# Patient Record
Sex: Female | Born: 1990 | Race: Black or African American | Hispanic: No | Marital: Single | Smoking: Never smoker
Health system: Southern US, Community
[De-identification: ages and names within clinical notes are randomized; demographics above are authoritative.]

## PROBLEM LIST (undated history)

## (undated) DIAGNOSIS — Z789 Other specified health status: Secondary | ICD-10-CM

## (undated) HISTORY — PX: DILATION AND CURETTAGE OF UTERUS: SHX78

---
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
## 2010-04-29 ENCOUNTER — Emergency Department (HOSPITAL_COMMUNITY): Admission: EM | Admit: 2010-04-29 | Discharge: 2010-04-29 | Payer: Self-pay | Admitting: Family Medicine

## 2010-08-14 NOTE — L&D Delivery Note (Signed)
Delivery Note At 11:15 PM a viable and healthy female was delivered via Vaginal, Spontaneous Delivery (Presentation:Right ; Occiput Anterior).  APGAR: , ; weight .   Placenta status: Intact , Spontaneous.  Cord: 3 vessels with a loose nuchal cord x 1  Pt was induced for Rh isoimmunization with rising antibody titers.  She was found to be complete and delivered a vigorous female infant in the vertex ROA presentation.  Cord was clamped and cut and the infant was passed to the patients abdomen.  Placenta was delivered spontaneously, intact, with 3VC.  Cord blood was collected.  A small First degree laceration was repaired with a figure-of-eight suture of 3-0 vicryl.  Mom and baby are doing well.  The infant will be transferred to the nursery for observation.  Anesthesia: Epidural  Episiotomy: None Lacerations: First degree Suture Repair: 3.0 vicryl Est. Blood Loss (mL): 250  Mom to postpartum.  Baby to nursery-stable.  Ashe Gago H. 07/12/2011, 11:34 PM

## 2010-10-27 LAB — POCT URINALYSIS DIPSTICK
Bilirubin Urine: NEGATIVE
Glucose, UA: NEGATIVE mg/dL
Hgb urine dipstick: NEGATIVE
Nitrite: NEGATIVE
Protein, ur: 100 mg/dL — AB
Specific Gravity, Urine: >=1.03 (ref 1.005–1.030)
Urobilinogen, UA: 0.2 mg/dL (ref 0.0–1.0)
pH: 6 (ref 5.0–8.0)

## 2010-10-27 LAB — POCT PREGNANCY, URINE: Preg Test, Ur: NEGATIVE

## 2010-12-14 LAB — RPR: RPR: NONREACTIVE

## 2010-12-14 LAB — HIV ANTIBODY (ROUTINE TESTING W REFLEX): HIV: NONREACTIVE

## 2010-12-14 LAB — RUBELLA ANTIBODY, IGM: Rubella: IMMUNE

## 2010-12-14 LAB — HEPATITIS B SURFACE ANTIGEN: Hepatitis B Surface Ag: NEGATIVE

## 2010-12-27 NOTE — Op Note (Signed)
NAME:  Pamela Buchanan, Pamela Buchanan NO.:  [id number]   MEDICAL RECORD NO.:  [id number]          PATIENT TYPE:  AMB   LOCATION:  SDC                           FACILITY:  [facility]   PHYSICIAN:  Crist Fat. Rivard, M.D. DATE OF BIRTH:  03/07/1991   DATE OF PROCEDURE:  07/14/2007  DATE OF DISCHARGE:                               OPERATIVE REPORT   PREOPERATIVE DIAGNOSIS:  Missed abortion.   POSTOPERATIVE DIAGNOSIS:  Missed abortion.   ANESTHESIA:  IV sedation and paracervical block.   PROCEDURE:  D and C.   SURGEON:  Silverio Lay, M.D.   ESTIMATED BLOOD LOSS:  Minimal.   PROCEDURE:  After being informed of the planned procedure with possible  complications including bleeding, infection and injury to uterus,  informed consent was obtained.  The patient was taken to OR #4 and given  IV sedation without complication.  She was placed in the lithotomy  position, prepped and draped in a sterile fashion and her bladder was  emptied with an in-and-out Foley catheter.  Pelvic exam reveals an  anteverted uterus, 10-12 weeks in size, mobile and two normal adnexa.  A  weighted speculum is inserted.  Then 1 cc of Novocaine 1% is injected in  the anterior lip of the cervix which is now grasped with a tenaculum  forceps.  We then proceed with a paracervical block using Novocaine 1%  20 cc in the usual fashion.  The cervix was already dilated and easily  allows a #31 Hegar dilator and the uterus is sounded at 9.5 cm.  Using a  #10 curved cannula we proceed with evacuation of the uterus which  suctioned a large amount of products of conception.  After evacuation is  completed a sharp curette is used to assess the endometrial cavity which  appears to be free of tissue including board cornua.  During that time  bleeding is a little heavier and nitrous oxide was discontinued and  Cytotec 1000 mcg per rectum was given to the patient.  Instruments were  then removed and a bimanual massage of the  uterus was performed.  Uterus  is now firm and bleeding is minimal.   Instrument and sponge count was complete x2.  Estimated blood loss  overall is minimal.  The procedure was very well tolerated by the  patient who is taken to the recovery room in a well and stable  condition.      Crist Fat Rivard, M.D.  Electronically Signed     SAR/MEDQ  D:  07/14/2007  T:  07/14/2007  Job:  [id number]

## 2011-02-03 ENCOUNTER — Inpatient Hospital Stay (HOSPITAL_COMMUNITY)
Admission: AD | Admit: 2011-02-03 | Discharge: 2011-02-04 | Disposition: A | Discharge status: Home / Self Care | Payer: Medicaid Other | Source: Ambulatory Visit | Attending: Obstetrics & Gynecology | Admitting: Obstetrics & Gynecology

## 2011-02-03 DIAGNOSIS — Z79899 Other long term (current) drug therapy: Secondary | ICD-10-CM | POA: Insufficient documentation

## 2011-02-03 DIAGNOSIS — O210 Mild hyperemesis gravidarum: Secondary | ICD-10-CM

## 2011-02-03 LAB — URINALYSIS, ROUTINE W REFLEX MICROSCOPIC
Glucose, UA: NEGATIVE mg/dL
Nitrite: NEGATIVE
Specific Gravity, Urine: 1.01 (ref 1.005–1.030)
pH: 6 (ref 5.0–8.0)

## 2011-02-03 LAB — URINE MICROSCOPIC-ADD ON

## 2011-03-25 LAB — GC/CHLAMYDIA PROBE AMP, GENITAL: Chlamydia: POSITIVE

## 2011-05-19 ENCOUNTER — Encounter (HOSPITAL_COMMUNITY): Payer: Self-pay | Admitting: *Deleted

## 2011-05-23 LAB — HCG, QUANTITATIVE, PREGNANCY: hCG, Beta Chain, Quant, S: 2305 — ABNORMAL HIGH

## 2011-05-23 LAB — CBC
HCT: 36.3
Hemoglobin: 12.3
MCHC: 34
RBC: 3.99

## 2011-05-23 LAB — URINALYSIS, ROUTINE W REFLEX MICROSCOPIC
Nitrite: NEGATIVE
Protein, ur: NEGATIVE
Urobilinogen, UA: 0.2

## 2011-05-23 LAB — ABO/RH: Weak D: NEGATIVE

## 2011-05-23 LAB — POCT PREGNANCY, URINE
Operator id: 117411
Preg Test, Ur: POSITIVE

## 2011-05-23 LAB — WET PREP, GENITAL

## 2011-05-23 LAB — RH IMMUNE GLOBULIN WORKUP (NOT WOMEN'S HOSP): Antibody Screen: NEGATIVE

## 2011-05-23 LAB — GC/CHLAMYDIA PROBE AMP, GENITAL: GC Probe Amp, Genital: NEGATIVE

## 2011-05-23 LAB — URINE MICROSCOPIC-ADD ON

## 2011-06-14 ENCOUNTER — Other Ambulatory Visit (HOSPITAL_COMMUNITY): Payer: Self-pay | Admitting: Obstetrics & Gynecology

## 2011-06-14 DIAGNOSIS — O360190 Maternal care for anti-D [Rh] antibodies, unspecified trimester, not applicable or unspecified: Secondary | ICD-10-CM

## 2011-06-14 DIAGNOSIS — O2690 Pregnancy related conditions, unspecified, unspecified trimester: Secondary | ICD-10-CM

## 2011-06-16 ENCOUNTER — Ambulatory Visit (HOSPITAL_COMMUNITY): Payer: Medicaid Other

## 2011-06-22 ENCOUNTER — Other Ambulatory Visit (HOSPITAL_COMMUNITY): Payer: Medicaid Other

## 2011-06-23 ENCOUNTER — Ambulatory Visit (HOSPITAL_COMMUNITY)
Admission: RE | Admit: 2011-06-23 | Discharge: 2011-06-23 | Disposition: A | Discharge status: Home / Self Care | Payer: Medicaid Other | Source: Ambulatory Visit | Attending: Obstetrics & Gynecology | Admitting: Obstetrics & Gynecology

## 2011-06-23 DIAGNOSIS — O360990 Maternal care for other rhesus isoimmunization, unspecified trimester, not applicable or unspecified: Secondary | ICD-10-CM | POA: Insufficient documentation

## 2011-06-23 DIAGNOSIS — O360190 Maternal care for anti-D [Rh] antibodies, unspecified trimester, not applicable or unspecified: Secondary | ICD-10-CM

## 2011-06-23 DIAGNOSIS — O2690 Pregnancy related conditions, unspecified, unspecified trimester: Secondary | ICD-10-CM

## 2011-06-23 DIAGNOSIS — Z3689 Encounter for other specified antenatal screening: Secondary | ICD-10-CM | POA: Insufficient documentation

## 2011-06-23 NOTE — Progress Notes (Signed)
Ultrasound in [facility]/OBGYN/EPIC.  Follow up U/S scheduled 

## 2011-06-26 ENCOUNTER — Other Ambulatory Visit: Payer: Self-pay

## 2011-06-30 ENCOUNTER — Other Ambulatory Visit (HOSPITAL_COMMUNITY): Payer: Self-pay | Admitting: Maternal and Fetal Medicine

## 2011-06-30 ENCOUNTER — Ambulatory Visit (HOSPITAL_COMMUNITY)
Admission: RE | Admit: 2011-06-30 | Discharge: 2011-06-30 | Disposition: A | Discharge status: Home / Self Care | Payer: Medicaid Other | Source: Ambulatory Visit | Attending: Obstetrics & Gynecology | Admitting: Obstetrics & Gynecology

## 2011-06-30 VITALS — BP 103/53 | HR 95 | Wt 157.0 lb

## 2011-06-30 DIAGNOSIS — Z3689 Encounter for other specified antenatal screening: Secondary | ICD-10-CM | POA: Insufficient documentation

## 2011-06-30 DIAGNOSIS — O360190 Maternal care for anti-D [Rh] antibodies, unspecified trimester, not applicable or unspecified: Secondary | ICD-10-CM

## 2011-06-30 DIAGNOSIS — O360990 Maternal care for other rhesus isoimmunization, unspecified trimester, not applicable or unspecified: Secondary | ICD-10-CM

## 2011-06-30 NOTE — Progress Notes (Signed)
Ms. Marsicano was seen for ultrasound appointment today.  Please see [facility]-OBGYN report for details.  

## 2011-07-03 NOTE — Progress Notes (Signed)
Encounter addended by: Marlana Latus, RN on: 07/03/2011 11:41 AM<BR>     Documentation filed: Chief Complaint Section, Episodes

## 2011-07-04 ENCOUNTER — Other Ambulatory Visit (HOSPITAL_COMMUNITY): Payer: Self-pay | Admitting: Maternal and Fetal Medicine

## 2011-07-04 ENCOUNTER — Ambulatory Visit (HOSPITAL_COMMUNITY)
Admission: RE | Admit: 2011-07-04 | Discharge: 2011-07-04 | Disposition: A | Discharge status: Home / Self Care | Payer: Medicaid Other | Source: Ambulatory Visit | Attending: Obstetrics & Gynecology | Admitting: Obstetrics & Gynecology

## 2011-07-04 DIAGNOSIS — O360990 Maternal care for other rhesus isoimmunization, unspecified trimester, not applicable or unspecified: Secondary | ICD-10-CM

## 2011-07-04 DIAGNOSIS — Z3689 Encounter for other specified antenatal screening: Secondary | ICD-10-CM | POA: Insufficient documentation

## 2011-07-04 DIAGNOSIS — O360190 Maternal care for anti-D [Rh] antibodies, unspecified trimester, not applicable or unspecified: Secondary | ICD-10-CM

## 2011-07-07 ENCOUNTER — Ambulatory Visit (HOSPITAL_COMMUNITY)
Admission: RE | Admit: 2011-07-07 | Discharge: 2011-07-07 | Disposition: A | Discharge status: Home / Self Care | Payer: Medicaid Other | Source: Ambulatory Visit | Attending: Obstetrics & Gynecology | Admitting: Obstetrics & Gynecology

## 2011-07-07 DIAGNOSIS — O360990 Maternal care for other rhesus isoimmunization, unspecified trimester, not applicable or unspecified: Secondary | ICD-10-CM

## 2011-07-07 DIAGNOSIS — Z3689 Encounter for other specified antenatal screening: Secondary | ICD-10-CM | POA: Insufficient documentation

## 2011-07-11 ENCOUNTER — Encounter (HOSPITAL_COMMUNITY): Payer: Self-pay | Admitting: *Deleted

## 2011-07-11 ENCOUNTER — Ambulatory Visit (HOSPITAL_COMMUNITY)
Admission: RE | Admit: 2011-07-11 | Discharge: 2011-07-11 | Disposition: A | Discharge status: Home / Self Care | Payer: Medicaid Other | Source: Ambulatory Visit | Attending: Obstetrics & Gynecology | Admitting: Obstetrics & Gynecology

## 2011-07-11 ENCOUNTER — Inpatient Hospital Stay (HOSPITAL_COMMUNITY)
Admission: AD | Admit: 2011-07-11 | Discharge: 2011-07-14 | DRG: 775 | Disposition: A | Discharge status: Home / Self Care | Payer: Medicaid Other | Source: Ambulatory Visit | Attending: Obstetrics & Gynecology | Admitting: Obstetrics & Gynecology

## 2011-07-11 DIAGNOSIS — O360990 Maternal care for other rhesus isoimmunization, unspecified trimester, not applicable or unspecified: Secondary | ICD-10-CM

## 2011-07-11 DIAGNOSIS — Z3689 Encounter for other specified antenatal screening: Secondary | ICD-10-CM | POA: Insufficient documentation

## 2011-07-11 HISTORY — DX: Other specified health status: Z78.9

## 2011-07-11 LAB — CBC
HCT: 35.4 % — ABNORMAL LOW (ref 36.0–46.0)
Hemoglobin: 12 g/dL (ref 12.0–15.0)
MCH: 30.9 pg (ref 26.0–34.0)
MCV: 91.2 fL (ref 78.0–100.0)
RBC: 3.88 MIL/uL (ref 3.87–5.11)

## 2011-07-11 LAB — STREP B DNA PROBE: GBS: POSITIVE

## 2011-07-11 MED ORDER — TERBUTALINE SULFATE 1 MG/ML IJ SOLN: 0.2500 mg | Freq: Once | INTRAMUSCULAR | Status: AC | PRN

## 2011-07-11 MED ORDER — ONDANSETRON HCL 4 MG/2ML IJ SOLN
4.0000 mg | Freq: Four times a day (QID) | INTRAMUSCULAR | Status: DC | PRN
  Administered 2011-07-12: 4 mg via INTRAVENOUS
  Filled 2011-07-11: qty 2

## 2011-07-11 MED ORDER — LIDOCAINE HCL (PF) 1 % IJ SOLN: 30.0000 mL | INTRAMUSCULAR | Status: DC | PRN

## 2011-07-11 MED ORDER — OXYTOCIN 20 UNITS IN LACTATED RINGERS INFUSION - SIMPLE: 125.0000 mL/h | INTRAVENOUS | Status: DC

## 2011-07-11 MED ORDER — LACTATED RINGERS IV SOLN
500.0000 mL | INTRAVENOUS | Status: DC | PRN
  Administered 2011-07-12 (×2): 500 mL via INTRAVENOUS

## 2011-07-11 MED ORDER — ACETAMINOPHEN 325 MG PO TABS: 650.0000 mg | ORAL_TABLET | ORAL | Status: DC | PRN

## 2011-07-11 MED ORDER — PENICILLIN G POTASSIUM 5000000 UNITS IJ SOLR
5.0000 10*6.[IU] | Freq: Once | INTRAMUSCULAR | Status: DC
  Administered 2011-07-12: 5 10*6.[IU] via INTRAVENOUS
  Filled 2011-07-11: qty 5

## 2011-07-11 MED ORDER — ZOLPIDEM TARTRATE 10 MG PO TABS: 10.0000 mg | ORAL_TABLET | Freq: Every evening | ORAL | Status: DC | PRN

## 2011-07-11 MED ORDER — PENICILLIN G POTASSIUM 5000000 UNITS IJ SOLR
2.5000 10*6.[IU] | INTRAMUSCULAR | Status: DC
  Administered 2011-07-12 (×5): 2.5 10*6.[IU] via INTRAVENOUS
  Filled 2011-07-11 (×10): qty 2.5

## 2011-07-11 MED ORDER — OXYTOCIN BOLUS FROM INFUSION
500.0000 mL | Freq: Once | INTRAVENOUS | Status: AC
  Administered 2011-07-12: 500 mL via INTRAVENOUS
  Filled 2011-07-11: qty 1000
  Filled 2011-07-11: qty 500

## 2011-07-11 MED ORDER — LACTATED RINGERS IV SOLN
INTRAVENOUS | Status: DC
  Administered 2011-07-11 – 2011-07-12 (×5): via INTRAVENOUS

## 2011-07-11 MED ORDER — OXYTOCIN 20 UNITS IN LACTATED RINGERS INFUSION - SIMPLE: 125.0000 mL/h | Freq: Once | INTRAVENOUS | Status: DC

## 2011-07-11 MED ORDER — IBUPROFEN 600 MG PO TABS: 600.0000 mg | ORAL_TABLET | Freq: Four times a day (QID) | ORAL | Status: DC | PRN

## 2011-07-11 MED ORDER — CITRIC ACID-SODIUM CITRATE 334-500 MG/5ML PO SOLN: 30.0000 mL | ORAL | Status: DC | PRN

## 2011-07-11 MED ORDER — BUTORPHANOL TARTRATE 2 MG/ML IJ SOLN
1.0000 mg | INTRAMUSCULAR | Status: DC | PRN
  Administered 2011-07-12: 1 mg via INTRAVENOUS
  Filled 2011-07-11: qty 1

## 2011-07-11 MED ORDER — OXYCODONE-ACETAMINOPHEN 5-325 MG PO TABS: 2.0000 | ORAL_TABLET | ORAL | Status: DC | PRN

## 2011-07-11 MED ORDER — MISOPROSTOL 25 MCG QUARTER TABLET
25.0000 ug | ORAL_TABLET | ORAL | Status: DC | PRN
  Administered 2011-07-11 – 2011-07-12 (×2): 25 ug via VAGINAL
  Filled 2011-07-11 (×2): qty 0.25

## 2011-07-11 MED ORDER — FLEET ENEMA 7-19 GM/118ML RE ENEM: 1.0000 | ENEMA | RECTAL | Status: DC | PRN

## 2011-07-11 NOTE — ED Notes (Signed)
Pt states she is coming in to [facility] tonight for induction at 1930.

## 2011-07-11 NOTE — Progress Notes (Signed)
Pamela Buchanan was seen for ultrasound appointment today.  Please see [facility]-OBGYN report for details.  

## 2011-07-11 NOTE — H&P (Signed)
  20 y.o. G2 P0010  Estimated Date of Delivery: 08/01/11 admitted at [redacted] weeks gestation for induction. Prenatal course complicated by:  1) Rh sensitization.  Initial antibody screen was negative but she had a 1:4 titer at 28 weeks.  Recent titer had risen to 1:16.  No evidence of hydrops on scans done at [facility] but they recommended delivery at 37 weeks due to risk of fetal red cell hemolysis.  2) First trimester screen showed NT thickness >95th %ile.  Fetal echocardiogram was normal.  Prenatal labs: Blood Type:O neg.  Screening tests for HIV, Syphilis, Hepatitis B, Rubella sensitivity, fetal anomalies, and gestational diabetes were negative.  She screened positive for perineal group B strep colonization .  Patient treated for +CT screen in first trimester.  Third trimester GC and CT screens were negative.   Afebrile, VSS Heart and Lungs: No active disease Abdomen: soft, gravid, EFW AGA. Cervical exam:  Closed  Impression: 37 week pregnancy with recent Rh sensitization.  Plan:  Induce per maternal fetal medicine recommendation.

## 2011-07-12 ENCOUNTER — Encounter (HOSPITAL_COMMUNITY): Payer: Self-pay | Admitting: Anesthesiology

## 2011-07-12 ENCOUNTER — Inpatient Hospital Stay (HOSPITAL_COMMUNITY): Payer: Medicaid Other | Admitting: Anesthesiology

## 2011-07-12 MED ORDER — LACTATED RINGERS IV SOLN: 500.0000 mL | Freq: Once | INTRAVENOUS | Status: DC

## 2011-07-12 MED ORDER — OXYTOCIN 20 UNITS IN LACTATED RINGERS INFUSION - SIMPLE
1.0000 m[IU]/min | INTRAVENOUS | Status: DC
  Administered 2011-07-12: 2 m[IU]/min via INTRAVENOUS

## 2011-07-12 MED ORDER — EPHEDRINE 5 MG/ML INJ
10.0000 mg | INTRAVENOUS | Status: DC | PRN
  Filled 2011-07-12 (×2): qty 4

## 2011-07-12 MED ORDER — DIPHENHYDRAMINE HCL 50 MG/ML IJ SOLN: 12.5000 mg | INTRAMUSCULAR | Status: DC | PRN

## 2011-07-12 MED ORDER — LIDOCAINE HCL 1.5 % IJ SOLN
INTRAMUSCULAR | Status: DC | PRN
  Administered 2011-07-12: 3 mL via EPIDURAL
  Administered 2011-07-12 (×2): 5 mL via EPIDURAL

## 2011-07-12 MED ORDER — EPHEDRINE 5 MG/ML INJ: 10.0000 mg | INTRAVENOUS | Status: DC | PRN

## 2011-07-12 MED ORDER — SODIUM BICARBONATE 8.4 % IV SOLN
INTRAVENOUS | Status: DC | PRN
  Administered 2011-07-12: 3 mL via EPIDURAL

## 2011-07-12 MED ORDER — FENTANYL 2.5 MCG/ML BUPIVACAINE 1/10 % EPIDURAL INFUSION (WH - ANES)
14.0000 mL/h | INTRAMUSCULAR | Status: DC
  Administered 2011-07-12 (×3): 14 mL/h via EPIDURAL
  Filled 2011-07-12 (×4): qty 60

## 2011-07-12 MED ORDER — TERBUTALINE SULFATE 1 MG/ML IJ SOLN: 0.2500 mg | Freq: Once | INTRAMUSCULAR | Status: AC | PRN

## 2011-07-12 MED ORDER — PHENYLEPHRINE 40 MCG/ML (10ML) SYRINGE FOR IV PUSH (FOR BLOOD PRESSURE SUPPORT)
80.0000 ug | PREFILLED_SYRINGE | INTRAVENOUS | Status: DC | PRN
  Filled 2011-07-12: qty 5

## 2011-07-12 MED ORDER — BUPIVACAINE HCL (PF) 0.25 % IJ SOLN
INTRAMUSCULAR | Status: DC | PRN
  Administered 2011-07-12: 3 mL

## 2011-07-12 MED ORDER — FENTANYL 2.5 MCG/ML BUPIVACAINE 1/10 % EPIDURAL INFUSION (WH - ANES)
INTRAMUSCULAR | Status: DC | PRN
  Administered 2011-07-12: 14 mL/h via EPIDURAL

## 2011-07-12 NOTE — Progress Notes (Signed)
Patient ID: Pamela Buchanan, female   DOB: 26-Sep-1990, 20 y.o.   MRN: [id number]  Labor PN  S: Comfortable O: Filed Vitals:   07/12/11 1633 07/12/11 1702 07/12/11 1731 07/12/11 1802  BP: 98/54 104/51 119/70 123/70  Pulse:  71 71 74  Temp:    97.7 F (36.5 C)  TempSrc:    Oral  Resp: 18 18 18 18   Height:      Weight:      SpO2:       Cervix 6/90/-1 FHT 140 reactive toco irregular  IUPC placed without difficulty  A/P  FWB reassuring Pit at 10, continue to increase for adequate labor

## 2011-07-12 NOTE — Progress Notes (Signed)
Patient ID: Pamela Buchanan, female   DOB: [birth year], 20 y.o.   MRN: [id number]  Labor Progress Note  S:  Feeling more pressure, uncomfortable O:  Filed Vitals:   07/12/11 1900 07/12/11 1931 07/12/11 2001 07/12/11 2031  BP: 109/55 117/69 129/75 118/69  Pulse: 72 74 77 62  Temp:      TempSrc:      Resp: 18 18 18 20   Height:      Weight:      SpO2:       Cervix 7-8/90/0-+1 FHT 140-150 + accels, reactive toco Q1-2  A/P FWB reassuring Continue pit

## 2011-07-12 NOTE — Progress Notes (Signed)
Not accurate. Pt. Moving around

## 2011-07-12 NOTE — Anesthesia Procedure Notes (Addendum)
Epidural Patient location during procedure: OB Start time: 07/12/2011 10:58 AM End time: 07/12/2011 11:08 AM Reason for block: procedure for pain  Staffing Anesthesiologist: Sandrea Hughs Performed by: anesthesiologist   Preanesthetic Checklist Completed: patient identified, site marked, surgical consent, pre-op evaluation, timeout performed, IV checked, risks and benefits discussed and monitors and equipment checked  Epidural Patient position: sitting Prep: site prepped and draped and DuraPrep Patient monitoring: continuous pulse ox and blood pressure Approach: midline Injection technique: LOR air  Needle:  Needle type: Tuohy  Needle gauge: 17 G Needle length: 9 cm Needle insertion depth: 7 cm Catheter type: closed end flexible Catheter size: 19 Gauge Catheter at skin depth: 12 cm Test dose: negative and 1.5% lidocaine  Assessment Events: blood not aspirated, injection not painful, no injection resistance, negative IV test and no paresthesia  Epidural Patient location during procedure: OB Start time: 07/12/2011 11:39 AM End time: 07/12/2011 11:44 AM Reason for block: procedure for pain  Staffing Anesthesiologist: Sandrea Hughs Performed by: anesthesiologist   Preanesthetic Checklist Completed: patient identified, site marked, surgical consent, pre-op evaluation, timeout performed, IV checked, risks and benefits discussed and monitors and equipment checked  Epidural Patient position: sitting Prep: site prepped and draped and DuraPrep Patient monitoring: continuous pulse ox and blood pressure Approach: midline Injection technique: LOR air  Needle:  Needle type: Tuohy  Needle gauge: 17 G Needle length: 9 cm Needle insertion depth: 6 cm Catheter type: closed end flexible Catheter size: 19 Gauge Catheter at skin depth: 11 cm Test dose: negative and 1.5% lidocaine  Assessment Sensory level: T8 Events: blood not aspirated, injection  not painful, no injection resistance, negative IV test and no paresthesia

## 2011-07-12 NOTE — Progress Notes (Signed)
Patient ID: Pamela Buchanan, female   DOB: Mar 23, 1991, 20 y.o.   MRN: [id number] Labor PN  S: Comfortable after epidural O:  Filed Vitals:   07/12/11 1232 07/12/11 1235 07/12/11 1300 07/12/11 1332  BP:   115/66 110/63  Pulse: 69 78 74 77  Temp:      TempSrc:      Resp:   18 18  Height:      Weight:      SpO2:  98%     Cvx 4/80/-2 FHT 140 reassuring with accels Toco: irregular  A/P 1) Cont Pitocin 2) FWB reassuring

## 2011-07-12 NOTE — Progress Notes (Signed)
Nursery notified and updated on pt Anti-D antibody titers that have increased from 1:4 to 1:16--Nursery to contact pt Peds to inform them as well of possible infant needs at birth

## 2011-07-12 NOTE — Anesthesia Preprocedure Evaluation (Signed)

## 2011-07-12 NOTE — Progress Notes (Signed)
Patient ID: Pamela Buchanan, female   DOB: [birth year], 20 y.o.   MRN: [id number] S: S/P stadol, groggy O: Filed Vitals:   07/12/11 0632 07/12/11 0700 07/12/11 0730 07/12/11 0800  BP: 111/60 121/57 124/61 129/47  Pulse: 90 91 89 93  Temp:  98.3 F (36.8 C)  97.5 F (36.4 C)  TempSrc:  Oral  Oral  Resp: 20 20 20 20   Height:      Weight:       FHT 140 + accels, reactive cvx 2/50/-2 toco coupling Q2-3  AROM clear fluid  A/P FWB reassuring Continue pit

## 2011-07-12 NOTE — Progress Notes (Signed)
Epidural pump alarming--anesthesia still at bedside--pump turned off until anesthesia is back to adjust

## 2011-07-12 NOTE — Progress Notes (Signed)
Low BPs noted--retook due to pt repositioning to determine if accurate

## 2011-07-13 ENCOUNTER — Other Ambulatory Visit: Payer: Self-pay | Admitting: Obstetrics and Gynecology

## 2011-07-13 ENCOUNTER — Encounter (HOSPITAL_COMMUNITY): Payer: Self-pay | Admitting: *Deleted

## 2011-07-13 LAB — CBC
Hemoglobin: 11.2 g/dL — ABNORMAL LOW (ref 12.0–15.0)
MCH: 31 pg (ref 26.0–34.0)
MCHC: 33.5 g/dL (ref 30.0–36.0)
RDW: 13.1 % (ref 11.5–15.5)

## 2011-07-13 LAB — RH IG WORKUP (INCLUDES ABO/RH)
DAT, IgG: NEGATIVE
Gestational Age(Wks): 37

## 2011-07-13 MED ORDER — BENZOCAINE-MENTHOL 20-0.5 % EX AERO
INHALATION_SPRAY | CUTANEOUS | Status: AC
  Administered 2011-07-13: 1 via TOPICAL
  Filled 2011-07-13: qty 56

## 2011-07-13 MED ORDER — ONDANSETRON HCL 4 MG PO TABS: 4.0000 mg | ORAL_TABLET | ORAL | Status: DC | PRN

## 2011-07-13 MED ORDER — SIMETHICONE 80 MG PO CHEW: 80.0000 mg | CHEWABLE_TABLET | ORAL | Status: DC | PRN

## 2011-07-13 MED ORDER — BENZOCAINE-MENTHOL 20-0.5 % EX AERO
1.0000 "application " | INHALATION_SPRAY | CUTANEOUS | Status: DC | PRN
  Administered 2011-07-13: 1 via TOPICAL

## 2011-07-13 MED ORDER — ONDANSETRON HCL 4 MG/2ML IJ SOLN: 4.0000 mg | INTRAMUSCULAR | Status: DC | PRN

## 2011-07-13 MED ORDER — TETANUS-DIPHTH-ACELL PERTUSSIS 5-2.5-18.5 LF-MCG/0.5 IM SUSP
0.5000 mL | Freq: Once | INTRAMUSCULAR | Status: AC
  Administered 2011-07-13: 0.5 mL via INTRAMUSCULAR
  Filled 2011-07-13: qty 0.5

## 2011-07-13 MED ORDER — IBUPROFEN 600 MG PO TABS
600.0000 mg | ORAL_TABLET | Freq: Four times a day (QID) | ORAL | Status: DC
  Administered 2011-07-13 – 2011-07-14 (×7): 600 mg via ORAL
  Filled 2011-07-13 (×7): qty 1

## 2011-07-13 MED ORDER — PRENATAL PLUS 27-1 MG PO TABS
1.0000 | ORAL_TABLET | Freq: Every day | ORAL | Status: DC
  Administered 2011-07-13 – 2011-07-14 (×2): 1 via ORAL
  Filled 2011-07-13 (×2): qty 1

## 2011-07-13 MED ORDER — CEPHALEXIN 500 MG PO CAPS
500.0000 mg | ORAL_CAPSULE | Freq: Four times a day (QID) | ORAL | Status: DC
  Administered 2011-07-13 – 2011-07-14 (×7): 500 mg via ORAL
  Filled 2011-07-13 (×10): qty 1

## 2011-07-13 MED ORDER — LANOLIN HYDROUS EX OINT: TOPICAL_OINTMENT | CUTANEOUS | Status: DC | PRN

## 2011-07-13 MED ORDER — WITCH HAZEL-GLYCERIN EX PADS: 1.0000 "application " | MEDICATED_PAD | CUTANEOUS | Status: DC | PRN

## 2011-07-13 MED ORDER — OXYCODONE-ACETAMINOPHEN 5-325 MG PO TABS
1.0000 | ORAL_TABLET | ORAL | Status: DC | PRN
  Administered 2011-07-13 (×5): 1 via ORAL
  Administered 2011-07-14 (×2): 2 via ORAL
  Filled 2011-07-13 (×3): qty 1
  Filled 2011-07-13 (×2): qty 2
  Filled 2011-07-13 (×2): qty 1

## 2011-07-13 MED ORDER — DIPHENHYDRAMINE HCL 25 MG PO CAPS: 25.0000 mg | ORAL_CAPSULE | Freq: Four times a day (QID) | ORAL | Status: DC | PRN

## 2011-07-13 MED ORDER — DIBUCAINE 1 % RE OINT: 1.0000 "application " | TOPICAL_OINTMENT | RECTAL | Status: DC | PRN

## 2011-07-13 MED ORDER — ZOLPIDEM TARTRATE 5 MG PO TABS: 5.0000 mg | ORAL_TABLET | Freq: Every evening | ORAL | Status: DC | PRN

## 2011-07-13 MED ORDER — SENNOSIDES-DOCUSATE SODIUM 8.6-50 MG PO TABS
2.0000 | ORAL_TABLET | Freq: Every day | ORAL | Status: DC
  Administered 2011-07-13: 2 via ORAL

## 2011-07-13 NOTE — Anesthesia Postprocedure Evaluation (Signed)
  Anesthesia Post-op Note  Patient: Pamela Buchanan  Procedure(s) Performed: * No procedures listed *  Patient Location: Mother/Baby  Anesthesia Type: Epidural  Level of Consciousness: alert  and oriented  Airway and Oxygen Therapy: Patient Spontanous Breathing  Post-op Pain: mild  Post-op Assessment: Patient's Cardiovascular Status Stable and Respiratory Function Stable  Post-op Vital Signs: stable  Complications: No apparent anesthesia complications

## 2011-07-13 NOTE — Progress Notes (Signed)
Patient does not need Rhogam per Thayer Ohm in the blood bank.  She has a true anti D and will not benefit from the rhogam injection.  Donzetta Sprung, R.N.

## 2011-07-13 NOTE — Addendum Note (Signed)
Addendum  created 07/13/11 1028 by Cephus Shelling   Modules edited:Charges VN, Notes Section

## 2011-07-13 NOTE — Progress Notes (Signed)
UR chart review completed.  

## 2011-07-13 NOTE — Progress Notes (Signed)
Patient has wound in right groin area, it was uncovered by patient and packing was seen, the patient pulled the packing out.  Small wound noted with minimal drainage. The site was covered with sterile 2x2 and then covered with occlusive dressing.  Patient states midwife lanced it in the office and did not send it off for culture.

## 2011-07-13 NOTE — Progress Notes (Signed)
Patient is eating, ambulating, voiding.  Pain control is good.  Filed Vitals:   07/13/11 0016 07/13/11 0018 07/13/11 0048 07/13/11 0118  BP: 115/92 105/62 107/63 99/62  Pulse: 129 61 66 76  Temp:  97.9 F (36.6 C)    TempSrc:  Oral    Resp: 20 20 20 20   Height:      Weight:      SpO2:        Fundus firm Perineum without swelling.  Lab Results  Component Value Date   WBC 18.4* 07/13/2011   HGB 11.2* 07/13/2011   HCT 33.4* 07/13/2011   MCV 92.5 07/13/2011   PLT 193 07/13/2011    O/Negative/-- (05/02 0000)- pt already isoimmunized.  No Rhogam. RI  Buchanan/P Post partum day 1. Circ in office.  Routine care.  Expect d/c per plan.    Pamela Buchanan

## 2011-07-13 NOTE — Anesthesia Postprocedure Evaluation (Signed)
Anesthesia Post Note  Patient: Pamela Buchanan  Procedure(s) Performed: * No procedures listed *  Anesthesia type: Epidural  Patient location: Mother/Baby  Post pain: Pain level controlled  Post assessment: Post-op Vital signs reviewed  Last Vitals:  Filed Vitals:   07/13/11 0800  BP: 97/54  Pulse: 83  Temp: 36.9 C  Resp: 16    Post vital signs: Reviewed  Level of consciousness: awake  Complications: No apparent anesthesia complications

## 2011-07-14 ENCOUNTER — Ambulatory Visit (HOSPITAL_COMMUNITY): Payer: Medicaid Other

## 2011-07-14 MED ORDER — OXYCODONE-ACETAMINOPHEN 5-325 MG PO TABS: 1.0000 | ORAL_TABLET | ORAL | Status: AC | PRN

## 2011-07-14 MED ORDER — IBUPROFEN 600 MG PO TABS: 600.0000 mg | ORAL_TABLET | Freq: Four times a day (QID) | ORAL | Status: AC | PRN

## 2011-07-14 NOTE — Progress Notes (Signed)
PPD#2 Pt doing well. Baby on phototherapy.  Baby likely to be in house for 2 more days. Will d/c mother. IMP/ stable PLAN/ d/c

## 2011-07-14 NOTE — Discharge Summary (Signed)
Obstetric Discharge Summary Reason for Admission: induction of labor Prenatal Procedures: NST and ultrasound Intrapartum Procedures: spontaneous vaginal delivery Postpartum Procedures: none Complications-Operative and Postpartum: 1 degree perineal laceration Hemoglobin  Date Value Range Status  07/13/2011 11.2* 12.0-15.0 (g/dL) Final     HCT  Date Value Range Status  07/13/2011 33.4* 36.0-46.0 (%) Final    Discharge Diagnoses: Rh isoimmunization  Discharge Information: Date: 07/14/2011 Activity: pelvic rest Diet: routine Medications: PNV, Ibuprofen and Percocet Condition: stable Instructions: refer to practice specific booklet Discharge to: home   Newborn Data: Live born female  Birth Weight: 7 lb 3 oz (3260 g) APGAR: 8, 9  Home with mother.  ANDERSON,MARK E 07/14/2011, 9:26 AM

## 2012-10-15 ENCOUNTER — Other Ambulatory Visit: Payer: Self-pay | Admitting: Obstetrics and Gynecology

## 2013-10-07 ENCOUNTER — Emergency Department (HOSPITAL_COMMUNITY)
Admission: EM | Admit: 2013-10-07 | Discharge: 2013-10-07 | Disposition: A | Discharge status: Home / Self Care | Payer: Medicaid Other | Source: Home / Self Care | Attending: Family Medicine | Admitting: Family Medicine

## 2013-10-07 ENCOUNTER — Encounter (HOSPITAL_COMMUNITY): Payer: Self-pay | Admitting: Emergency Medicine

## 2013-10-07 DIAGNOSIS — H1089 Other conjunctivitis: Secondary | ICD-10-CM

## 2013-10-07 DIAGNOSIS — H109 Unspecified conjunctivitis: Secondary | ICD-10-CM

## 2013-10-07 DIAGNOSIS — B9689 Other specified bacterial agents as the cause of diseases classified elsewhere: Secondary | ICD-10-CM

## 2013-10-07 DIAGNOSIS — A499 Bacterial infection, unspecified: Secondary | ICD-10-CM

## 2013-10-07 MED ORDER — POLYMYXIN B-TRIMETHOPRIM 10000-0.1 UNIT/ML-% OP SOLN: 2.0000 [drp] | OPHTHALMIC | Status: DC

## 2013-10-07 NOTE — ED Provider Notes (Signed)
Pamela Buchanan is a 23 y.o. female who presents to Urgent Care today for left eye injection starting yesterday associated with discharge and crusting. No eye pain or significant blurry vision. No fevers chills nausea vomiting or diarrhea. Patient uses contacts but has removed them today. No cough congestion runny nose etc.   Past Medical History  Diagnosis Date  . No pertinent past medical history    History  Substance Use Topics  . Smoking status: Never Smoker   . Smokeless tobacco: Never Used  . Alcohol Use: No   ROS as above Medications: No current facility-administered medications for this encounter.   Current Outpatient Prescriptions  Medication Sig Dispense Refill  . trimethoprim-polymyxin b (POLYTRIM) ophthalmic solution Place 2 drops into the left eye every 4 (four) hours.  10 mL  0    Exam:  BP 107/68  Pulse 76  Temp(Src) 98.8 F (37.1 C) (Oral)  Resp 18  SpO2 98%  LMP 09/25/2013 Gen: Well NAD HEENT: EOMI,  MMM RIGHT eye normal. Left eye conjunctiva injection. PERRLA bilaterally   No results found for this or any previous visit (from the past 24 hour(s)). No results found.  Assessment and Plan: 23 y.o. female with bacterial conjunctivitis of the left eye. Plan to use Polytrim antibiotic eyedrops. Avoid contacts. Followup with ophthalmology as needed.  Discussed warning signs or symptoms. Please see discharge instructions. Patient expresses understanding.    Rodolph BongEvan S Nattie Lazenby, MD 10/07/13 [phone]

## 2013-10-07 NOTE — ED Notes (Signed)
C/o left eye irritation since yesterday States she does have drainage, does burn, and has woke up with crust around eye lid.   Eye drops used but no relief only clear redness Does have a runny nose.

## 2013-10-07 NOTE — Discharge Instructions (Signed)
Thank you for coming in today. Use polytrim eye drops every 4 hours for 7 days.  Do not use contacts until the eye returns to normal.  Follow up with your ophthalmologist if not getting better.   Bacterial Conjunctivitis Bacterial conjunctivitis, commonly called pink eye, is an inflammation of the clear membrane that covers the white part of the eye (conjunctiva). The inflammation can also happen on the underside of the eyelids. The blood vessels in the conjunctiva become inflamed causing the eye to become red or pink. Bacterial conjunctivitis may spread easily from one eye to another and from person to person (contagious).  CAUSES  Bacterial conjunctivitis is caused by bacteria. The bacteria may come from your own skin, your upper respiratory tract, or from someone else with bacterial conjunctivitis. SYMPTOMS  The normally white color of the eye or the underside of the eyelid is usually pink or red. The pink eye is usually associated with irritation, tearing, and some sensitivity to light. Bacterial conjunctivitis is often associated with a thick, yellowish discharge from the eye. The discharge may turn into a crust on the eyelids overnight, which causes your eyelids to stick together. If a discharge is present, there may also be some blurred vision in the affected eye. DIAGNOSIS  Bacterial conjunctivitis is diagnosed by your caregiver through an eye exam and the symptoms that you report. Your caregiver looks for changes in the surface tissues of your eyes, which may point to the specific type of conjunctivitis. A sample of any discharge may be collected on a cotton-tip swab if you have a severe case of conjunctivitis, if your cornea is affected, or if you keep getting repeat infections that do not respond to treatment. The sample will be sent to a lab to see if the inflammation is caused by a bacterial infection and to see if the infection will respond to antibiotic medicines. TREATMENT   Bacterial  conjunctivitis is treated with antibiotics. Antibiotic eyedrops are most often used. However, antibiotic ointments are also available. Antibiotics pills are sometimes used. Artificial tears or eye washes may ease discomfort. HOME CARE INSTRUCTIONS   To ease discomfort, apply a cool, clean wash cloth to your eye for 10 20 minutes, 3 4 times a day.  Gently wipe away any drainage from your eye with a warm, wet washcloth or a cotton ball.  Wash your hands often with soap and water. Use paper towels to dry your hands.  Do not share towels or wash cloths. This may spread the infection.  Change or wash your pillow case every day.  You should not use eye makeup until the infection is gone.  Do not operate machinery or drive if your vision is blurred.  Stop using contacts lenses. Ask your caregiver how to sterilize or replace your contacts before using them again. This depends on the type of contact lenses that you use.  When applying medicine to the infected eye, do not touch the edge of your eyelid with the eyedrop bottle or ointment tube. SEEK IMMEDIATE MEDICAL CARE IF:   Your infection has not improved within 3 days after beginning treatment.  You had yellow discharge from your eye and it returns.  You have increased eye pain.  Your eye redness is spreading.  Your vision becomes blurred.  You have a fever or persistent symptoms for more than 2 3 days.  You have a fever and your symptoms suddenly get worse.  You have facial pain, redness, or swelling. MAKE SURE YOU:  Understand these instructions.  Will watch your condition.  Will get help right away if you are not doing well or get worse. Document Released: 07/31/2005 Document Revised: 04/24/2012 Document Reviewed: 01/01/2012 [facility] Patient Information 2014 [place], [place].

## 2014-02-17 ENCOUNTER — Encounter (HOSPITAL_COMMUNITY): Payer: Self-pay | Admitting: Emergency Medicine

## 2014-02-17 DIAGNOSIS — Z79899 Other long term (current) drug therapy: Secondary | ICD-10-CM | POA: Insufficient documentation

## 2014-02-17 DIAGNOSIS — N390 Urinary tract infection, site not specified: Secondary | ICD-10-CM | POA: Diagnosis not present

## 2014-02-17 DIAGNOSIS — R300 Dysuria: Secondary | ICD-10-CM | POA: Diagnosis not present

## 2014-02-17 DIAGNOSIS — Z3202 Encounter for pregnancy test, result negative: Secondary | ICD-10-CM | POA: Insufficient documentation

## 2014-02-17 DIAGNOSIS — R109 Unspecified abdominal pain: Secondary | ICD-10-CM | POA: Diagnosis present

## 2014-02-17 NOTE — ED Notes (Signed)
Presents with lower abdominal pain began Sunday associated with yellow/white thin vaginal discharge. Pain is as sharp and 10/10. Pt also reports a little bit of diarrhea. LMP 6/27

## 2014-02-18 ENCOUNTER — Emergency Department (HOSPITAL_COMMUNITY)
Admission: EM | Admit: 2014-02-18 | Discharge: 2014-02-18 | Disposition: A | Discharge status: Home / Self Care | Payer: Medicaid Other | Attending: Emergency Medicine | Admitting: Emergency Medicine

## 2014-02-18 ENCOUNTER — Encounter (HOSPITAL_COMMUNITY): Payer: Self-pay | Admitting: Emergency Medicine

## 2014-02-18 DIAGNOSIS — N390 Urinary tract infection, site not specified: Secondary | ICD-10-CM

## 2014-02-18 LAB — URINE MICROSCOPIC-ADD ON

## 2014-02-18 LAB — URINALYSIS, ROUTINE W REFLEX MICROSCOPIC
Bilirubin Urine: NEGATIVE
GLUCOSE, UA: NEGATIVE mg/dL
KETONES UR: 15 mg/dL — AB
Nitrite: POSITIVE — AB
PROTEIN: 30 mg/dL — AB
Specific Gravity, Urine: 1.034 — ABNORMAL HIGH (ref 1.005–1.030)
UROBILINOGEN UA: 0.2 mg/dL (ref 0.0–1.0)
pH: 6 (ref 5.0–8.0)

## 2014-02-18 LAB — POC URINE PREG, ED: PREG TEST UR: NEGATIVE

## 2014-02-18 MED ORDER — PHENAZOPYRIDINE HCL 100 MG PO TABS
200.0000 mg | ORAL_TABLET | Freq: Once | ORAL | Status: AC
  Administered 2014-02-18: 200 mg via ORAL
  Filled 2014-02-18: qty 2

## 2014-02-18 MED ORDER — KETOROLAC TROMETHAMINE 60 MG/2ML IM SOLN
60.0000 mg | Freq: Once | INTRAMUSCULAR | Status: AC
  Administered 2014-02-18: 60 mg via INTRAMUSCULAR
  Filled 2014-02-18: qty 2

## 2014-02-18 MED ORDER — NITROFURANTOIN MONOHYD MACRO 100 MG PO CAPS
100.0000 mg | ORAL_CAPSULE | Freq: Once | ORAL | Status: AC
  Administered 2014-02-18: 100 mg via ORAL
  Filled 2014-02-18: qty 1

## 2014-02-18 MED ORDER — HYDROCODONE-ACETAMINOPHEN 5-325 MG PO TABS: 1.0000 | ORAL_TABLET | Freq: Four times a day (QID) | ORAL | Status: DC | PRN

## 2014-02-18 MED ORDER — PHENAZOPYRIDINE HCL 200 MG PO TABS: 200.0000 mg | ORAL_TABLET | Freq: Three times a day (TID) | ORAL | Status: DC

## 2014-02-18 MED ORDER — NITROFURANTOIN MONOHYD MACRO 100 MG PO CAPS: 100.0000 mg | ORAL_CAPSULE | Freq: Two times a day (BID) | ORAL | Status: DC

## 2014-02-18 NOTE — ED Provider Notes (Signed)
CSN: [id number]     Arrival date & time 02/17/14  2317 History   First MD Initiated Contact with Patient 02/18/14 0310     Chief Complaint  Patient presents with  . Abdominal Pain     (Consider location/radiation/quality/duration/timing/severity/associated sxs/prior Treatment) Patient is a 23 y.o. female presenting with abdominal pain. The history is provided by the patient.  Abdominal Pain Pain location:  Suprapubic Pain quality: aching   Pain radiates to:  Does not radiate Pain severity:  Moderate Onset quality:  Gradual Timing:  Constant Progression:  Unchanged Chronicity:  New Context: not trauma   Relieved by:  Nothing Worsened by:  Nothing tried Ineffective treatments:  None tried Associated symptoms: dysuria   Associated symptoms: no fever, no vaginal bleeding and no vaginal discharge   Risk factors: has not had multiple surgeries     Past Medical History  Diagnosis Date  . No pertinent past medical history    Past Surgical History  Procedure Laterality Date  . Dilation and curettage of uterus     History reviewed. No pertinent family history. History  Substance Use Topics  . Smoking status: Never Smoker   . Smokeless tobacco: Never Used  . Alcohol Use: No   OB History   Grav Para Term Preterm Abortions TAB SAB Ect Mult Living   2 1 1  1  1   1      Review of Systems  Constitutional: Negative for fever.  Gastrointestinal: Positive for abdominal pain.  Genitourinary: Positive for dysuria. Negative for flank pain, vaginal bleeding and vaginal discharge.  All other systems reviewed and are negative.     Allergies  Review of patient's allergies indicates no known allergies.  Home Medications   Prior to Admission medications   Medication Sig Start Date End Date Taking? Authorizing Provider  HYDROcodone-acetaminophen (NORCO) 5-325 MG per tablet Take 1 tablet by mouth every 6 (six) hours as needed. 02/18/14   Taja Pentland K Jaun Galluzzo-Rasch, MD  nitrofurantoin,  macrocrystal-monohydrate, (MACROBID) 100 MG capsule Take 1 capsule (100 mg total) by mouth 2 (two) times daily. X 7 days 02/18/14   Zenas Santa K Agostino Gorin-Rasch, MD  phenazopyridine (PYRIDIUM) 200 MG tablet Take 1 tablet (200 mg total) by mouth 3 (three) times daily. 02/18/14   Joby Richart K Pama Roskos-Rasch, MD   BP 109/66  Pulse 75  Temp(Src) 97.7 F (36.5 C) (Oral)  Resp 18  Ht 5\' 1"  (1.549 m)  Wt 172 lb (78.019 kg)  BMI 32.52 kg/m2  SpO2 99%  LMP 02/07/2014 Physical Exam  Constitutional: She is oriented to person, place, and time. She appears well-developed and well-nourished. No distress.  HENT:  Head: Normocephalic and atraumatic.  Mouth/Throat: Oropharynx is clear and moist.  Eyes: Conjunctivae are normal. Pupils are equal, round, and reactive to light.  Neck: Normal range of motion. Neck supple.  Cardiovascular: Normal rate, regular rhythm and intact distal pulses.   Pulmonary/Chest: Effort normal and breath sounds normal. She has no wheezes. She has no rales.  Abdominal: Soft. Bowel sounds are normal. There is no tenderness. There is no rebound and no guarding.  Musculoskeletal: Normal range of motion.  Neurological: She is alert and oriented to person, place, and time.  Skin: Skin is warm.  Psychiatric: She has a normal mood and affect.    ED Course  Procedures (including critical care time) Labs Review Labs Reviewed  URINALYSIS, ROUTINE W REFLEX MICROSCOPIC - Abnormal; Notable for the following:    APPearance CLOUDY (*)    Specific Gravity,  Urine 1.034 (*)    Hgb urine dipstick LARGE (*)    Ketones, ur 15 (*)    Protein, ur 30 (*)    Nitrite POSITIVE (*)    Leukocytes, UA MODERATE (*)    All other components within normal limits  URINE MICROSCOPIC-ADD ON - Abnormal; Notable for the following:    Squamous Epithelial / LPF FEW (*)    Bacteria, UA MANY (*)    All other components within normal limits  POC URINE PREG, ED    Imaging Review No results found.   EKG  Interpretation None      MDM   Final diagnoses:  UTI (lower urinary tract infection)    Will treat with pain medication, antibiotics and pyridium.  Use barrier protection x 1 month.  No contact lenses x 5 days due to risk of staining from pyridium   Shaindy Reader Smitty CordsK Meghen Akopyan-Rasch, MD 02/18/14 [phone]

## 2014-06-15 ENCOUNTER — Encounter (HOSPITAL_COMMUNITY): Payer: Self-pay | Admitting: Emergency Medicine

## 2014-07-17 ENCOUNTER — Emergency Department (HOSPITAL_COMMUNITY)
Admission: EM | Admit: 2014-07-17 | Discharge: 2014-07-17 | Disposition: A | Discharge status: Home / Self Care | Payer: Medicaid Other | Attending: Emergency Medicine | Admitting: Emergency Medicine

## 2014-07-17 ENCOUNTER — Encounter (HOSPITAL_COMMUNITY): Payer: Self-pay | Admitting: *Deleted

## 2014-07-17 DIAGNOSIS — J069 Acute upper respiratory infection, unspecified: Secondary | ICD-10-CM | POA: Insufficient documentation

## 2014-07-17 DIAGNOSIS — R05 Cough: Secondary | ICD-10-CM

## 2014-07-17 DIAGNOSIS — R059 Cough, unspecified: Secondary | ICD-10-CM

## 2014-07-17 DIAGNOSIS — Z79899 Other long term (current) drug therapy: Secondary | ICD-10-CM | POA: Diagnosis not present

## 2014-07-17 MED ORDER — IPRATROPIUM-ALBUTEROL 0.5-2.5 (3) MG/3ML IN SOLN
3.0000 mL | RESPIRATORY_TRACT | Status: DC
  Administered 2014-07-17: 3 mL via RESPIRATORY_TRACT

## 2014-07-17 MED ORDER — DM-GUAIFENESIN ER 30-600 MG PO TB12: 1.0000 | ORAL_TABLET | Freq: Two times a day (BID) | ORAL | Status: DC

## 2014-07-17 NOTE — Discharge Instructions (Signed)
Cough, Adult  A cough is a reflex that helps clear your throat and airways. It can help heal the body or may be a reaction to an irritated airway. A cough may only last 2 or 3 weeks (acute) or may last more than 8 weeks (chronic).  CAUSES Acute cough:  Viral or bacterial infections. Chronic cough:  Infections.  Allergies.  Asthma.  Post-nasal drip.  Smoking.  Heartburn or acid reflux.  Some medicines.  Chronic lung problems (COPD).  Cancer. SYMPTOMS   Cough.  Fever.  Chest pain.  Increased breathing rate.  High-pitched whistling sound when breathing (wheezing).  Colored mucus that you cough up (sputum). TREATMENT   A bacterial cough may be treated with antibiotic medicine.  A viral cough must run its course and will not respond to antibiotics.  Your caregiver may recommend other treatments if you have a chronic cough. HOME CARE INSTRUCTIONS   Only take over-the-counter or prescription medicines for pain, discomfort, or fever as directed by your caregiver. Use cough suppressants only as directed by your caregiver.  Use a cold steam vaporizer or humidifier in your bedroom or home to help loosen secretions.  Sleep in a semi-upright position if your cough is worse at night.  Rest as needed.  Stop smoking if you smoke. SEEK IMMEDIATE MEDICAL CARE IF:   You have pus in your sputum.  Your cough starts to worsen.  You cannot control your cough with suppressants and are losing sleep.  You begin coughing up blood.  You have difficulty breathing.  You develop pain which is getting worse or is uncontrolled with medicine.  You have a fever. MAKE SURE YOU:   Understand these instructions.  Will watch your condition.  Will get help right away if you are not doing well or get worse. Document Released: 01/27/2011 Document Revised: 10/23/2011 Document Reviewed: 01/27/2011 [facility] Patient Information 2015 [place], [place]. This information is not intended  to replace advice given to you by your health care provider. Make sure you discuss any questions you have with your health care provider.   You were evaluated in the ED today for your cough. It appears that you may have an upper respiratory infection that is likely a virus. Please continue to take Mucinex DM as prescribed to you for symptom management. Please continue to drink plenty of fluids. You may do salt water rinses. Please follow-up with primary care within the next 3-5 days for further evaluation and management of your symptoms. Return to ED for worsening symptoms, chest pain, shortness of breath, abdominal pain

## 2014-07-17 NOTE — ED Provider Notes (Signed)
CSN: [id number]     Arrival date & time 07/17/14  1906 History   First MD Initiated Contact with Patient 07/17/14 2001     Chief Complaint  Patient presents with  . Cough     (Consider location/radiation/quality/duration/timing/severity/associated sxs/prior Treatment) HPI Alva GarnetLaturia S Mesick is a [age] y.o. female with no significant past medical history comes in for evaluation of cough. Patient states for the past 3 days she has had a mildly productive cough with clear to greenish yellow sputum. She denies any hemoptysis, shortness of breath or overt chest pain. She has tried NyQuil will without relief. She also reports associated rhinorrhea, facial congestion. She denies any fevers or facial pain, sore throat. No sick contacts. Nonsmoker  Past Medical History  Diagnosis Date  . No pertinent past medical history    Past Surgical History  Procedure Laterality Date  . Dilation and curettage of uterus     No family history on file. History  Substance Use Topics  . Smoking status: Never Smoker   . Smokeless tobacco: Never Used  . Alcohol Use: No   OB History    Gravida Para Term Preterm AB TAB SAB Ectopic Multiple Living   2 1 1  1  1   1      Review of Systems  Constitutional: Negative for fever.  HENT: Positive for postnasal drip, rhinorrhea and sinus pressure. Negative for sneezing and sore throat.   Eyes: Negative for visual disturbance.  Respiratory: Positive for cough. Negative for shortness of breath.   Cardiovascular: Negative for chest pain and leg swelling.  Gastrointestinal: Negative for abdominal pain.  Endocrine: Negative for polyuria.  Genitourinary: Negative for dysuria.  Skin: Negative for rash.  Neurological: Negative for headaches.      Allergies  Review of patient's allergies indicates no known allergies.  Home Medications   Prior to Admission medications   Medication Sig Start Date End Date Taking? Authorizing Provider  HYDROcodone-acetaminophen (NORCO)  5-325 MG per tablet Take 1 tablet by mouth every 6 (six) hours as needed. 02/18/14   April K Palumbo-Rasch, MD  nitrofurantoin, macrocrystal-monohydrate, (MACROBID) 100 MG capsule Take 1 capsule (100 mg total) by mouth 2 (two) times daily. X 7 days 02/18/14   April K Palumbo-Rasch, MD  phenazopyridine (PYRIDIUM) 200 MG tablet Take 1 tablet (200 mg total) by mouth 3 (three) times daily. 02/18/14   April K Palumbo-Rasch, MD   BP 108/57 mmHg  Pulse 92  Temp(Src) 98.1 F (36.7 C) (Oral)  Resp 20  Ht 5\' 1"  (1.549 m)  Wt 180 lb (81.647 kg)  BMI 34.03 kg/m2  SpO2 99%  LMP 07/11/2014 Physical Exam  Constitutional: She is oriented to person, place, and time. She appears well-developed and well-nourished.  HENT:  Head: Normocephalic and atraumatic.  Mouth/Throat: Oropharynx is clear and moist.  Eyes: Conjunctivae are normal. Pupils are equal, round, and reactive to light. Right eye exhibits no discharge. Left eye exhibits no discharge. No scleral icterus.  Neck: Neck supple.  Cardiovascular: Normal rate, regular rhythm and normal heart sounds.   Pulmonary/Chest: Effort normal and breath sounds normal. No respiratory distress. She has no wheezes. She has no rales. She exhibits no tenderness.  Abdominal: Soft. There is no tenderness.  Musculoskeletal: She exhibits no tenderness.  Neurological: She is alert and oriented to person, place, and time.  Cranial Nerves II-XII grossly intact  Skin: Skin is warm and dry. No rash noted.  Psychiatric: She has a normal mood and affect.  Nursing note  and vitals reviewed.   ED Course  Procedures (including critical care time) Labs Review Labs Reviewed - No data to display  Imaging Review No results found.   EKG Interpretation None      MDM  Alva GarnetLaturia S Glace is a 23 y.o. female with no PMH comes in for evaluation of 3 day hx of cough and URI sx.  Vitals stable - WNL -afebrile Pt resting comfortably in ED. reports DuoNeb made her feel "a little  better".  PERC negative. -2 Well's score.  PE--not concerning for other acute or emergent pathology. Lung exam benign. Patient symptomology likely due to viral etiology. Symptoms consistent with URI. Low concern for other emergent pathology, pneumonia, pneumothorax, PE, ACS.  Will DC with Mucinex DM for cough symptom relief. Discussed f/u with PCP and return precautions, pt very amenable to plan. Patient stable, in good condition and is appropriate for discharge   Final diagnoses:  Cough        Sharlene MottsBenjamin W Takyah Ciaramitaro, PA-C 07/18/14 1426  Gerhard Munchobert Lockwood, MD 07/20/14 [phone]

## 2014-07-17 NOTE — ED Notes (Signed)
The pt is c/o a cold with productive cough for 3 days no temp.  lmp nov 28th

## 2015-02-10 ENCOUNTER — Emergency Department (HOSPITAL_COMMUNITY)
Admission: EM | Admit: 2015-02-10 | Discharge: 2015-02-11 | Disposition: A | Discharge status: Home / Self Care | Payer: Medicaid Other | Attending: Emergency Medicine | Admitting: Emergency Medicine

## 2015-02-10 ENCOUNTER — Encounter (HOSPITAL_COMMUNITY): Payer: Self-pay | Admitting: Emergency Medicine

## 2015-02-10 DIAGNOSIS — Z3202 Encounter for pregnancy test, result negative: Secondary | ICD-10-CM | POA: Diagnosis not present

## 2015-02-10 DIAGNOSIS — N898 Other specified noninflammatory disorders of vagina: Secondary | ICD-10-CM | POA: Diagnosis not present

## 2015-02-10 DIAGNOSIS — Z79899 Other long term (current) drug therapy: Secondary | ICD-10-CM | POA: Insufficient documentation

## 2015-02-10 DIAGNOSIS — N938 Other specified abnormal uterine and vaginal bleeding: Secondary | ICD-10-CM | POA: Insufficient documentation

## 2015-02-10 DIAGNOSIS — N939 Abnormal uterine and vaginal bleeding, unspecified: Secondary | ICD-10-CM | POA: Diagnosis present

## 2015-02-10 LAB — COMPREHENSIVE METABOLIC PANEL
ALT: 13 U/L — ABNORMAL LOW (ref 14–54)
AST: 20 U/L (ref 15–41)
Albumin: 4 g/dL (ref 3.5–5.0)
Alkaline Phosphatase: 71 U/L (ref 38–126)
Anion gap: 9 (ref 5–15)
BUN: 6 mg/dL (ref 6–20)
CALCIUM: 9.3 mg/dL (ref 8.9–10.3)
CO2: 27 mmol/L (ref 22–32)
Chloride: 103 mmol/L (ref 101–111)
Creatinine, Ser: 0.74 mg/dL (ref 0.44–1.00)
GFR calc Af Amer: >60 mL/min (ref >60)
GFR calc non Af Amer: >60 mL/min (ref >60)
GLUCOSE: 90 mg/dL (ref 65–99)
POTASSIUM: 3.8 mmol/L (ref 3.5–5.1)
Sodium: 139 mmol/L (ref 135–145)
TOTAL PROTEIN: 7.9 g/dL (ref 6.5–8.1)
Total Bilirubin: 0.3 mg/dL (ref 0.3–1.2)

## 2015-02-10 LAB — CBC WITH DIFFERENTIAL/PLATELET
BASOS ABS: 0 10*3/uL (ref 0.0–0.1)
Basophils Relative: 0 % (ref 0–1)
EOS ABS: 0.1 10*3/uL (ref 0.0–0.7)
Eosinophils Relative: 1 % (ref 0–5)
HCT: 38.8 % (ref 36.0–46.0)
Hemoglobin: 12.9 g/dL (ref 12.0–15.0)
LYMPHS PCT: 37 % (ref 12–46)
Lymphs Abs: 4.3 10*3/uL — ABNORMAL HIGH (ref 0.7–4.0)
MCH: 29.2 pg (ref 26.0–34.0)
MCHC: 33.2 g/dL (ref 30.0–36.0)
MCV: 87.8 fL (ref 78.0–100.0)
Monocytes Absolute: 0.7 10*3/uL (ref 0.1–1.0)
Monocytes Relative: 6 % (ref 3–12)
NEUTROS PCT: 56 % (ref 43–77)
Neutro Abs: 6.4 10*3/uL (ref 1.7–7.7)
Platelets: 276 10*3/uL (ref 150–400)
RBC: 4.42 MIL/uL (ref 3.87–5.11)
RDW: 12.3 % (ref 11.5–15.5)
WBC: 11.5 10*3/uL — ABNORMAL HIGH (ref 4.0–10.5)

## 2015-02-10 LAB — URINE MICROSCOPIC-ADD ON

## 2015-02-10 LAB — URINALYSIS, ROUTINE W REFLEX MICROSCOPIC
Bilirubin Urine: NEGATIVE
Glucose, UA: NEGATIVE mg/dL
KETONES UR: NEGATIVE mg/dL
Nitrite: NEGATIVE
PH: 6 (ref 5.0–8.0)
Protein, ur: NEGATIVE mg/dL
Specific Gravity, Urine: 1.003 — ABNORMAL LOW (ref 1.005–1.030)
Urobilinogen, UA: 0.2 mg/dL (ref 0.0–1.0)

## 2015-02-10 LAB — WET PREP, GENITAL
Clue Cells Wet Prep HPF POC: NONE SEEN
Trich, Wet Prep: NONE SEEN
Yeast Wet Prep HPF POC: NONE SEEN

## 2015-02-10 LAB — POC URINE PREG, ED: Preg Test, Ur: NEGATIVE

## 2015-02-10 MED ORDER — CEFTRIAXONE SODIUM 250 MG IJ SOLR
250.0000 mg | Freq: Once | INTRAMUSCULAR | Status: AC
  Administered 2015-02-11: 250 mg via INTRAMUSCULAR
  Filled 2015-02-10: qty 250

## 2015-02-10 MED ORDER — AZITHROMYCIN 250 MG PO TABS
1000.0000 mg | ORAL_TABLET | Freq: Once | ORAL | Status: AC
  Administered 2015-02-11: 1000 mg via ORAL
  Filled 2015-02-10: qty 4

## 2015-02-10 NOTE — Discharge Instructions (Signed)
Please follow up with your doctor tomorrow as previously scheduled.  You have been evaluated for your abnormal vaginal bleeding.  You have also been receiving antibiotic during this visit.  If you tested positive for any specific infection, you will be contacted in the next few days.  Abnormal Uterine Bleeding Abnormal uterine bleeding can affect women at various stages in life, including teenagers, women in their reproductive years, pregnant women, and women who have reached menopause. Several kinds of uterine bleeding are considered abnormal, including:  Bleeding or spotting between periods.   Bleeding after sexual intercourse.   Bleeding that is heavier or more than normal.   Periods that last longer than usual.  Bleeding after menopause.  Many cases of abnormal uterine bleeding are minor and simple to treat, while others are more serious. Any type of abnormal bleeding should be evaluated by your health care provider. Treatment will depend on the cause of the bleeding. HOME CARE INSTRUCTIONS Monitor your condition for any changes. The following actions may help to alleviate any discomfort you are experiencing:  Avoid the use of tampons and douches as directed by your health care provider.  Change your pads frequently. You should get regular pelvic exams and Pap tests. Keep all follow-up appointments for diagnostic tests as directed by your health care provider.  SEEK MEDICAL CARE IF:   Your bleeding lasts more than 1 week.   You feel dizzy at times.  SEEK IMMEDIATE MEDICAL CARE IF:   You pass out.   You are changing pads every 15 to 30 minutes.   You have abdominal pain.  You have a fever.   You become sweaty or weak.   You are passing large blood clots from the vagina.   You start to feel nauseous and vomit. MAKE SURE YOU:   Understand these instructions.  Will watch your condition.  Will get help right away if you are not doing well or get  worse. Document Released: 07/31/2005 Document Revised: 08/05/2013 Document Reviewed: 02/27/2013 [facility] Patient Information 2015 [place] HarborExitCare, [place]LL[place]. This information is not intended to replace advice given to you by your health care provider. Make sure you discuss any questions you have with your health care provider.

## 2015-02-10 NOTE — ED Notes (Signed)
Pt. reports vaginal bleeding " spotting " onset today , denies dysuria , no fever or chills.

## 2015-02-10 NOTE — ED Provider Notes (Signed)
CSN: [id number]     Arrival date & time 02/10/15  2035 History   First MD Initiated Contact with Patient 02/10/15 2240     Chief Complaint  Patient presents with  . Vaginal Bleeding     (Consider location/radiation/quality/duration/timing/severity/associated sxs/prior Treatment) HPI   24 year old female presenting for evaluation of vaginal bleeding. Patient reports since yesterday she has been experiencing vaginal spotting that leads to vaginal bleeding.  Today she report having some mild lower back and abdominal cramping.  She has a normal menstrual cycle 2 weeks ago and was concern about her spotting. She also notice increase odor and mild vaginal discharge. She has an IUD.  She denies fever, chills, headache, chest pain, dysuria, urinary frequency or urgency, or rash. She is sexually active with one partner. She has prior history of chlamydia. She is a G2 P1. Otherwise she has no other concern.  Past Medical History  Diagnosis Date  . No pertinent past medical history    Past Surgical History  Procedure Laterality Date  . Dilation and curettage of uterus     No family history on file. History  Substance Use Topics  . Smoking status: Never Smoker   . Smokeless tobacco: Never Used  . Alcohol Use: No   OB History    Gravida Para Term Preterm AB TAB SAB Ectopic Multiple Living   2 1 1  1  1   1      Review of Systems  Constitutional: Negative for fever.  Genitourinary: Positive for vaginal bleeding and vaginal discharge.  All other systems reviewed and are negative.     Allergies  Review of patient's allergies indicates no known allergies.  Home Medications   Prior to Admission medications   Medication Sig Start Date End Date Taking? Authorizing Provider  dextromethorphan-guaiFENesin (MUCINEX DM) 30-600 MG per 12 hr tablet Take 1 tablet by mouth 2 (two) times daily. 07/17/14   Joycie PeekBenjamin Cartner, PA-C  HYDROcodone-acetaminophen (NORCO) 5-325 MG per tablet Take 1 tablet by  mouth every 6 (six) hours as needed. 02/18/14   April Palumbo, MD  nitrofurantoin, macrocrystal-monohydrate, (MACROBID) 100 MG capsule Take 1 capsule (100 mg total) by mouth 2 (two) times daily. X 7 days 02/18/14   April Palumbo, MD  phenazopyridine (PYRIDIUM) 200 MG tablet Take 1 tablet (200 mg total) by mouth 3 (three) times daily. 02/18/14   April Palumbo, MD   BP 105/57 mmHg  Pulse 77  Temp(Src) 98 F (36.7 C) (Oral)  Resp 20  Ht 5\' 1"  (1.549 m)  Wt 182 lb 2 oz (82.611 kg)  BMI 34.43 kg/m2  SpO2 99%  LMP 01/27/2015 Physical Exam  Constitutional: She appears well-developed and well-nourished. No distress.  HENT:  Head: Atraumatic.  Eyes: Conjunctivae are normal.  Neck: Neck supple.  Cardiovascular: Normal rate and regular rhythm.   Pulmonary/Chest: Effort normal and breath sounds normal.  Abdominal: Soft. Bowel sounds are normal. She exhibits no distension. There is no tenderness.  Genitourinary:  Chaperone present during exam. No inguinal lymphadenopathy or inguinal hernia noted. Normal external genitalia. Vaginal vault with moderate yellow discharge coming from a closed cervical os with IUD string in place. Dystrophic mucosal changes at the T-zone. Mildly friable. On bimanual examination, no adnexal tenderness or cervical motion tenderness. No mass appreciated. Exam is difficult due to large body habitus.  Neurological: She is alert.  Skin: No rash noted.  Psychiatric: She has a normal mood and affect.  Nursing note and vitals reviewed.   ED Course  Procedures (including critical care time)  Patient here for vaginal spotting and vaginal discharge. Her pregnancy test is negative. Her labs are reassuring. UA without signs of urinary tract infection. No significant pain on pelvic Examination concerning for PID. She has a nonsurgical abdomen.  11:49 PM Wet prep shows moderate WBC. Since patient has low abdominal pain, vaginal discharge with vaginal bleeding and strong odor, we will  treat prophylactically for possible STD with Rocephin and Zithromax. Otherwise patient does not have a significant anemia . She is stable for discharge. Return precautions discussed. Patient to avoid sexual activities until symptoms completely resolved.  Labs Review Labs Reviewed  WET PREP, GENITAL - Abnormal; Notable for the following:    WBC, Wet Prep HPF POC MODERATE (*)    All other components within normal limits  URINALYSIS, ROUTINE W REFLEX MICROSCOPIC (NOT AT [facility]) - Abnormal; Notable for the following:    Specific Gravity, Urine 1.003 (*)    Hgb urine dipstick MODERATE (*)    Leukocytes, UA MODERATE (*)    All other components within normal limits  CBC WITH DIFFERENTIAL/PLATELET - Abnormal; Notable for the following:    WBC 11.5 (*)    Lymphs Abs 4.3 (*)    All other components within normal limits  COMPREHENSIVE METABOLIC PANEL - Abnormal; Notable for the following:    ALT 13 (*)    All other components within normal limits  URINE MICROSCOPIC-ADD ON - Abnormal; Notable for the following:    Bacteria, UA FEW (*)    All other components within normal limits  RPR  HIV ANTIBODY (ROUTINE TESTING)  POC URINE PREG, ED  GC/CHLAMYDIA PROBE AMP ([place]) NOT AT [facility]    Imaging Review No results found.   EKG Interpretation None      MDM   Final diagnoses:  Dysfunctional uterine bleeding  Vaginal discharge    BP 117/74 mmHg  Pulse 84  Temp(Src) 98.6 F (37 C) (Oral)  Resp 16  Ht  (1.549 m)  Wt 182 lb 2 oz (82.611 kg)  BMI 34.43 kg/m2  SpO2 100%  LMP 01/27/2015     Fayrene Helper, PA-C 02/11/15 1610  Mancel Bale, MD 02/11/15 [id number]

## 2015-02-11 LAB — RPR: RPR Ser Ql: NONREACTIVE

## 2015-02-11 LAB — GC/CHLAMYDIA PROBE AMP (~~LOC~~) NOT AT ARMC
Chlamydia: NEGATIVE
Neisseria Gonorrhea: NEGATIVE

## 2015-02-11 LAB — HIV ANTIBODY (ROUTINE TESTING W REFLEX): HIV SCREEN 4TH GENERATION: NONREACTIVE

## 2015-02-11 MED ORDER — LIDOCAINE HCL (PF) 1 % IJ SOLN
INTRAMUSCULAR | Status: AC
  Administered 2015-02-11: 0.9 mL
  Filled 2015-02-11: qty 5

## 2015-03-02 ENCOUNTER — Emergency Department (HOSPITAL_COMMUNITY): Payer: Medicaid Other

## 2015-03-02 ENCOUNTER — Encounter (HOSPITAL_COMMUNITY): Payer: Self-pay | Admitting: Emergency Medicine

## 2015-03-02 DIAGNOSIS — R002 Palpitations: Secondary | ICD-10-CM | POA: Insufficient documentation

## 2015-03-02 DIAGNOSIS — R079 Chest pain, unspecified: Secondary | ICD-10-CM | POA: Diagnosis present

## 2015-03-02 LAB — CBC
HEMATOCRIT: 36.2 % (ref 36.0–46.0)
Hemoglobin: 12.2 g/dL (ref 12.0–15.0)
MCH: 29.5 pg (ref 26.0–34.0)
MCHC: 33.7 g/dL (ref 30.0–36.0)
MCV: 87.7 fL (ref 78.0–100.0)
Platelets: 274 10*3/uL (ref 150–400)
RBC: 4.13 MIL/uL (ref 3.87–5.11)
RDW: 12.5 % (ref 11.5–15.5)
WBC: 13 10*3/uL — AB (ref 4.0–10.5)

## 2015-03-02 LAB — BASIC METABOLIC PANEL
ANION GAP: 6 (ref 5–15)
BUN: 7 mg/dL (ref 6–20)
CALCIUM: 8.6 mg/dL — AB (ref 8.9–10.3)
CO2: 25 mmol/L (ref 22–32)
Chloride: 105 mmol/L (ref 101–111)
Creatinine, Ser: 0.79 mg/dL (ref 0.44–1.00)
GFR calc non Af Amer: >60 mL/min (ref >60)
GLUCOSE: 101 mg/dL — AB (ref 65–99)
Potassium: 3.5 mmol/L (ref 3.5–5.1)
Sodium: 136 mmol/L (ref 135–145)

## 2015-03-02 LAB — I-STAT TROPONIN, ED: Troponin i, poc: 0 ng/mL (ref 0.00–0.08)

## 2015-03-02 NOTE — ED Notes (Signed)
Pt. reports intermittent palpitations and right upper chest pain radiating to right upper back onset this morning , denies nausea or vomitting / no diarrhea.

## 2015-03-03 ENCOUNTER — Emergency Department (HOSPITAL_COMMUNITY)
Admission: EM | Admit: 2015-03-03 | Discharge: 2015-03-03 | Disposition: A | Discharge status: Home / Self Care | Payer: Medicaid Other | Attending: Emergency Medicine | Admitting: Emergency Medicine

## 2015-03-03 DIAGNOSIS — R002 Palpitations: Secondary | ICD-10-CM

## 2015-03-03 NOTE — Discharge Instructions (Signed)

## 2015-03-03 NOTE — ED Notes (Signed)
MD at bedside. 

## 2015-03-03 NOTE — ED Provider Notes (Signed)
CSN: [id number]     Arrival date & time 03/02/15  2208 History   This chart was scribed for Marisa Severin, MD by Abel Presto, ED Scribe. This patient was seen in room A02C/A02C and the patient's care was started at 1:27 AM.      Chief Complaint  Patient presents with  . Chest Pain     The history is provided by the patient. No language interpreter was used.   HPI Comments: Pamela Buchanan is a 24 y.o. female who presents to the Emergency Department complaining of dull intermittent chest pains lasting approximately 2 minutes with onset 2 weeks ago, worsening this evening to waxing and waning lasting 10 minutes at a time.  Pt notes associated palpitations and states she needs to take deep breaths during episodes. She states she feels well in between episodes. She reports she mostly drinks water and denies frequent caffeine use. She denies cardiac PMHx but reports cardiac FHx. Pt has NKDA. She is on Enlow. Pt is not a smoker and denies drug use. She denies nausea, vomiting, swelling and pain in bilateral LEs.   Past Medical History  Diagnosis Date  . No pertinent past medical history    Past Surgical History  Procedure Laterality Date  . Dilation and curettage of uterus     No family history on file. History  Substance Use Topics  . Smoking status: Never Smoker   . Smokeless tobacco: Never Used  . Alcohol Use: No   OB History    Gravida Para Term Preterm AB TAB SAB Ectopic Multiple Living   Review of Systems  Cardiovascular: Positive for chest pain and palpitations. Negative for leg swelling.  Gastrointestinal: Negative for nausea and vomiting.      Allergies  Review of patient's allergies indicates no known allergies.  Home Medications   Prior to Admission medications   Not on File   BP 101/57 mmHg  Pulse 82  Temp(Src) 99.2 F (37.3 C) (Oral)  Resp 20  SpO2 98%  LMP 01/27/2015 Physical Exam  Constitutional: She is oriented to person, place,  and time. She appears well-developed and well-nourished.  HENT:  Head: Normocephalic and atraumatic.  Nose: Nose normal.  Mouth/Throat: Oropharynx is clear and moist.  Eyes: Conjunctivae and EOM are normal. Pupils are equal, round, and reactive to light.  Neck: Normal range of motion. Neck supple. No JVD present. No tracheal deviation present. No thyromegaly present.  Cardiovascular: Normal rate, regular rhythm, normal heart sounds and intact distal pulses.  Exam reveals no gallop and no friction rub.   No murmur heard. Pulmonary/Chest: Effort normal and breath sounds normal. No stridor. No respiratory distress. She has no wheezes. She has no rales. She exhibits no tenderness.  Abdominal: Soft. Bowel sounds are normal. She exhibits no distension and no mass. There is no tenderness. There is no rebound and no guarding.  Musculoskeletal: Normal range of motion. She exhibits no edema or tenderness.  Lymphadenopathy:    She has no cervical adenopathy.  Neurological: She is alert and oriented to person, place, and time. She displays normal reflexes. She exhibits normal muscle tone. Coordination normal.  Skin: Skin is warm and dry. No rash noted. No erythema. No pallor.  Psychiatric: She has a normal mood and affect. Her behavior is normal. Judgment and thought content normal.  Nursing note and vitals reviewed.   ED Course  Procedures (including critical care time)  DIAGNOSTIC STUDIES: Oxygen Saturation is 98% on room air, normal by my interpretation.    COORDINATION OF CARE: 1:32 AM Discussed treatment plan with patient at beside, the patient agrees with the plan and has no further questions at this time.   Labs Review Labs Reviewed  BASIC METABOLIC PANEL - Abnormal; Notable for the following:    Glucose, Bld 101 (*)    Calcium 8.6 (*)    All other components within normal limits  CBC - Abnormal; Notable for the following:    WBC 13.0 (*)    All other components within normal limits   I-STAT TROPOININ, ED    Imaging Review Dg Chest 2 View  03/02/2015   CLINICAL DATA:  Acute onset chest pain earlier today. Intermittent heart fluttering associated with shortness of breath over the past 2 weeks.  EXAM: CHEST  2 VIEW  COMPARISON:  None.  FINDINGS: Cardiomediastinal silhouette unremarkable. Lungs clear. Bronchovascular markings normal. Pulmonary vascularity normal. No pneumothorax. No pleural effusions. Visualized bony thorax intact.  IMPRESSION: Normal examination.  There are   Electronically Signed   By: Hulan Saashomas  Lawrence M.D.   On: 03/02/2015 22:47     EKG Interpretation   Date/Time:  Tuesday March 02 2015 22:11:26 EDT Ventricular Rate:  87 PR Interval:  158 QRS Duration: 76 QT Interval:  358 QTC Calculation: 430 R Axis:   55 Text Interpretation:  Normal sinus rhythm Normal ECG Confirmed by Jaleeah Slight   MD, Shevelle Smither ([id number]) on 03/02/2015 11:38:17 PM      MDM   Final diagnoses:  Palpitations    I personally performed the services described in this documentation, which was scribed in my presence. The recorded information has been reviewed and is accurate.  24 yo female with intermittent episodes of palpitations lasting 2 minutes.  Exam normal, ekg normal.  Will refer for outpatient holter monitor    Marisa Severinlga Kaiyu Mirabal, MD 03/03/15 0151

## 2015-06-20 ENCOUNTER — Emergency Department (HOSPITAL_COMMUNITY): Payer: Managed Care, Other (non HMO)

## 2015-06-20 ENCOUNTER — Emergency Department (HOSPITAL_COMMUNITY)
Admission: EM | Admit: 2015-06-20 | Discharge: 2015-06-20 | Disposition: A | Discharge status: Home / Self Care | Payer: Managed Care, Other (non HMO) | Attending: Emergency Medicine | Admitting: Emergency Medicine

## 2015-06-20 ENCOUNTER — Encounter (HOSPITAL_COMMUNITY): Payer: Self-pay | Admitting: Emergency Medicine

## 2015-06-20 DIAGNOSIS — R5383 Other fatigue: Secondary | ICD-10-CM | POA: Diagnosis not present

## 2015-06-20 DIAGNOSIS — J069 Acute upper respiratory infection, unspecified: Secondary | ICD-10-CM | POA: Insufficient documentation

## 2015-06-20 DIAGNOSIS — R42 Dizziness and giddiness: Secondary | ICD-10-CM | POA: Diagnosis present

## 2015-06-20 LAB — CBC WITH DIFFERENTIAL/PLATELET
BASOS PCT: 0 %
Basophils Absolute: 0 10*3/uL (ref 0.0–0.1)
EOS PCT: 1 %
Eosinophils Absolute: 0.1 10*3/uL (ref 0.0–0.7)
HCT: 38 % (ref 36.0–46.0)
Hemoglobin: 12.7 g/dL (ref 12.0–15.0)
Lymphocytes Relative: 27 %
Lymphs Abs: 2.7 10*3/uL (ref 0.7–4.0)
MCH: 29.7 pg (ref 26.0–34.0)
MCHC: 33.4 g/dL (ref 30.0–36.0)
MCV: 88.8 fL (ref 78.0–100.0)
MONO ABS: 0.7 10*3/uL (ref 0.1–1.0)
MONOS PCT: 7 %
NEUTROS ABS: 6.4 10*3/uL (ref 1.7–7.7)
Neutrophils Relative %: 65 %
Platelets: 266 10*3/uL (ref 150–400)
RBC: 4.28 MIL/uL (ref 3.87–5.11)
RDW: 12.7 % (ref 11.5–15.5)
WBC: 9.9 10*3/uL (ref 4.0–10.5)

## 2015-06-20 LAB — BASIC METABOLIC PANEL
Anion gap: 7 (ref 5–15)
BUN: 6 mg/dL (ref 6–20)
CALCIUM: 8.9 mg/dL (ref 8.9–10.3)
CO2: 25 mmol/L (ref 22–32)
CREATININE: 0.88 mg/dL (ref 0.44–1.00)
Chloride: 106 mmol/L (ref 101–111)
GFR calc non Af Amer: >60 mL/min (ref >60)
GLUCOSE: 90 mg/dL (ref 65–99)
Potassium: 3.5 mmol/L (ref 3.5–5.1)
Sodium: 138 mmol/L (ref 135–145)

## 2015-06-20 MED ORDER — HYDROCODONE-HOMATROPINE 5-1.5 MG/5ML PO SYRP: 5.0000 mL | ORAL_SOLUTION | Freq: Four times a day (QID) | ORAL | Status: DC | PRN

## 2015-06-20 MED ORDER — SODIUM CHLORIDE 0.9 % IV BOLUS (SEPSIS)
1000.0000 mL | Freq: Once | INTRAVENOUS | Status: AC
  Administered 2015-06-20: 1000 mL via INTRAVENOUS

## 2015-06-20 MED ORDER — IBUPROFEN 800 MG PO TABS: 800.0000 mg | ORAL_TABLET | Freq: Three times a day (TID) | ORAL | Status: DC

## 2015-06-20 MED ORDER — DIPHENHYDRAMINE HCL 25 MG PO TABS: 25.0000 mg | ORAL_TABLET | Freq: Four times a day (QID) | ORAL | Status: DC

## 2015-06-20 NOTE — Discharge Instructions (Signed)
Take ibuprofen as needed for body aches. Take hycodan as needed for cough. Take benadryl as needed for congestion.

## 2015-06-20 NOTE — ED Notes (Addendum)
Pt from home for eval of generalized body aches with headache and productive cough x2 days. Pt states has been having "night sweats" as well. Denies any n/v/d at this time. Pt states she got dizzy at home and thought she needed to come and get checked out. nad noted, alert and oriented, skin warm and dry. Denies any cp or sob.

## 2015-06-20 NOTE — ED Provider Notes (Signed)
CSN: [id number]     Arrival date & time 06/20/15  1951 History   First MD Initiated Contact with Patient 06/20/15 2014     Chief Complaint  Patient presents with  . Generalized Body Aches  . Dizziness     (Consider location/radiation/quality/duration/timing/severity/associated sxs/prior Treatment) Patient is a 24 y.o. female presenting with URI. The history is provided by the patient. No language interpreter was used.  URI Presenting symptoms: cough, fatigue, fever and sore throat   Cough:    Cough characteristics:  Hacking   Sputum characteristics:  Nondescript   Severity:  Moderate   Onset quality:  Gradual   Duration:  3 days   Timing:  Constant   Progression:  Unchanged   Chronicity:  New Fever:    Duration:  3 days   Timing:  Intermittent   Temp source:  Subjective   Progression:  Unchanged Severity:  Moderate Onset quality:  Gradual Duration:  3 days Timing:  Constant Progression:  Unchanged Chronicity:  New Relieved by:  Nothing Ineffective treatments:  None tried Associated symptoms: no arthralgias, no headaches, no myalgias, no neck pain, no sinus pain, no sneezing, no swollen glands and no wheezing   Risk factors: sick contacts   Risk factors: not elderly, no chronic cardiac disease, no chronic kidney disease, no chronic respiratory disease, no diabetes mellitus, no immunosuppression, no recent illness and no recent travel     Past Medical History  Diagnosis Date  . No pertinent past medical history    Past Surgical History  Procedure Laterality Date  . Dilation and curettage of uterus     No family history on file. Social History  Substance Use Topics  . Smoking status: Never Smoker   . Smokeless tobacco: Never Used  . Alcohol Use: No   OB History    Gravida Para Term Preterm AB TAB SAB Ectopic Multiple Living   Review of Systems  Constitutional: Positive for fever and fatigue.  HENT: Positive for sore throat. Negative for  sneezing.   Respiratory: Positive for cough. Negative for wheezing.   Musculoskeletal: Negative for myalgias, arthralgias and neck pain.  Neurological: Positive for light-headedness. Negative for headaches.  All other systems reviewed and are negative.     Allergies  Review of patient's allergies indicates no known allergies.  Home Medications   Prior to Admission medications   Medication Sig Start Date End Date Taking? Authorizing Provider  ibuprofen (ADVIL,MOTRIN) 200 MG tablet Take 200 mg by mouth every 6 (six) hours as needed for mild pain.   Yes Historical Provider, MD  levonorgestrel (MIRENA) 20 MCG/24HR IUD 1 each by Intrauterine route once.   Yes Historical Provider, MD   BP 110/57 mmHg  Pulse 85  Temp(Src) 98 F (36.7 C) (Oral)  Resp 16  Ht  (1.549 m)  Wt 170 lb (77.111 kg)  BMI 32.14 kg/m2  SpO2 100% Physical Exam  Constitutional: She appears well-developed and well-nourished. No distress.  HENT:  Head: Normocephalic and atraumatic.  Right Ear: External ear normal.  Left Ear: External ear normal.  Mouth/Throat: Oropharynx is clear and moist. No oropharyngeal exudate.  Eyes: Conjunctivae and EOM are normal.  Neck: Normal range of motion.  Cardiovascular: Normal rate and regular rhythm.  Exam reveals no gallop and no friction rub.   No murmur heard. Pulmonary/Chest: Effort normal and breath sounds normal. She has no wheezes. She has no rales. She  exhibits no tenderness.  Abdominal: Soft. She exhibits no distension. There is no tenderness.  Musculoskeletal: Normal range of motion.  Neurological: She is alert. Coordination normal.  Speech is goal-oriented. Moves limbs without ataxia.   Skin: Skin is warm and dry.  Psychiatric: She has a normal mood and affect. Her behavior is normal.  Nursing note and vitals reviewed.   ED Course  Procedures (including critical care time) Labs Review Labs Reviewed  CBC WITH DIFFERENTIAL/PLATELET  BASIC METABOLIC PANEL     Imaging Review Dg Chest 2 View  06/20/2015  CLINICAL DATA:  Cough and fever. EXAM: CHEST - 2 VIEW COMPARISON:  Two-view chest x-ray 03/02/2015. FINDINGS: The heart size and mediastinal contours are within normal limits. Both lungs are clear. The visualized skeletal structures are unremarkable. IMPRESSION: Negative two view chest x-ray Electronically Signed   By: Marin Robertshristopher  Mattern M.D.   On: 06/20/2015 20:52   I have personally reviewed and evaluated these images and lab results as part of my medical decision-making.   EKG Interpretation None      MDM   Final diagnoses:  URI (upper respiratory infection)    9:33 PM Patient's chest xray and labs unremarkable for acute changes. Patient's orthostatics completed after patient received a liter of IV fluid. Patient likely dehydrated prior, causing lightheadedness with change in position. Vitals stable and patient afebrile.   Emilia BeckKaitlyn Consepcion Utt, PA-C 06/21/15 [id number]  Raeford RazorStephen Kohut, MD 06/24/15 2202

## 2016-05-15 ENCOUNTER — Emergency Department (HOSPITAL_COMMUNITY)
Admission: EM | Admit: 2016-05-15 | Discharge: 2016-05-15 | Disposition: A | Discharge status: Home / Self Care | Payer: Medicaid Other | Attending: Emergency Medicine | Admitting: Emergency Medicine

## 2016-05-15 ENCOUNTER — Encounter (HOSPITAL_COMMUNITY): Payer: Self-pay | Admitting: Emergency Medicine

## 2016-05-15 DIAGNOSIS — L290 Pruritus ani: Secondary | ICD-10-CM | POA: Diagnosis present

## 2016-05-15 DIAGNOSIS — L299 Pruritus, unspecified: Secondary | ICD-10-CM

## 2016-05-15 MED ORDER — HYDROCORTISONE 2.5 % RE CREA: TOPICAL_CREAM | RECTAL | 0 refills | Status: DC

## 2016-05-15 NOTE — ED Notes (Signed)
Declined W/C at D/C and was escorted to lobby by RN. 

## 2016-05-15 NOTE — ED Provider Notes (Signed)
[facility]-EMERGENCY DEPT Provider Note   CSN: [id number] Arrival date & time: 05/15/16  1321     History   Chief Complaint Chief Complaint  Patient presents with  . Pruritis    Rectum    HPI Pamela Buchanan is a 25 y.o. female.  Patient is 25 yo F presenting to ED with chief complaint of anal itching since February 2017. Symptoms have been worsening over the past 2 months. She denies any pain, burning, or discharge at her anus. She has tried Benadryl and Preparation H which provided no relief, but warm baths seem to help. Symptoms are not associated with certain time of day or worse at night. Not currently sexually active, and denies ever having anal intercourse. No change of soaps or detergents. Denies any abdominal pain, blood in stools or on toilet paper, discharge at her anus, dysuria, or vaginal discharge.      Past Medical History:  Diagnosis Date  . No pertinent past medical history     Patient Active Problem List   Diagnosis Date Noted  . Rh alloimmunization, maternal, antepartum 06/23/2011    Past Surgical History:  Procedure Laterality Date  . DILATION AND CURETTAGE OF UTERUS      OB History    Gravida Para Term Preterm AB Living   2 1 1   1 1    SAB TAB Ectopic Multiple Live Births   1       1       Home Medications    Prior to Admission medications   Medication Sig Start Date End Date Taking? Authorizing Provider  diphenhydrAMINE (BENADRYL) 25 MG tablet Take 1 tablet (25 mg total) by mouth every 6 (six) hours. 06/20/15   Kaitlyn Szekalski, PA-C  HYDROcodone-homatropine (HYCODAN) 5-1.5 MG/5ML syrup Take 5 mLs by mouth every 6 (six) hours as needed. 06/20/15   Kaitlyn Szekalski, PA-C  ibuprofen (ADVIL,MOTRIN) 800 MG tablet Take 1 tablet (800 mg total) by mouth 3 (three) times daily. 06/20/15   Emilia Beck, PA-C  levonorgestrel (MIRENA) 20 MCG/24HR IUD 1 each by Intrauterine route once.    Historical Provider, MD    Family History History reviewed. No  pertinent family history.  Social History Social History  Substance Use Topics  . Smoking status: Never Smoker  . Smokeless tobacco: Never Used  . Alcohol use Yes     Comment: occasional socially     Allergies   Review of patient's allergies indicates no known allergies.   Review of Systems Review of Systems  Constitutional: Negative for chills and fever.  Gastrointestinal: Negative for abdominal pain, anal bleeding, blood in stool, constipation, diarrhea and rectal pain.  Genitourinary: Negative for dysuria, genital sores and vaginal discharge.  Skin: Negative for rash and wound.     Physical Exam Updated Vital Signs BP 113/71 (BP Location: Right Arm)   Pulse 70   Temp 98.3 F (36.8 C) (Oral)   Resp 16   Ht 5\' 1"  (1.549 m)   Wt 75.3 kg   SpO2 100%   BMI 31.37 kg/m   Physical Exam  Constitutional: She appears well-developed and well-nourished. No distress.  HENT:  Head: Normocephalic and atraumatic.  Mouth/Throat: Oropharynx is clear and moist.  Eyes: Conjunctivae are normal.  Neck: Normal range of motion.  Cardiovascular: Normal rate.   Pulmonary/Chest: Effort normal. No respiratory distress.  Abdominal: Soft. There is no tenderness.  Genitourinary:  Genitourinary Comments: Chaperone present No gross blood, mass, fissure, or hemorrhoids noted. No excoriations, erythema, discharge,  or signs of infection. Digital rectal exam deferred at patient's request  Musculoskeletal: Normal range of motion.  Neurological: She is alert. Gait normal.  Skin: Skin is warm and dry. No rash noted.  Psychiatric: She has a normal mood and affect.  Nursing note and vitals reviewed.    ED Treatments / Results  Labs (all labs ordered are listed, but only abnormal results are displayed) Labs Reviewed - No data to display  EKG  EKG Interpretation None       Radiology No results found.  Procedures Procedures (including critical care time)  Medications Ordered in  ED Medications - No data to display   Initial Impression / Assessment and Plan / ED Course  I have reviewed the triage vital signs and the nursing notes.  Pertinent labs & imaging results that were available during my care of the patient were reviewed by me and considered in my medical decision making (see chart for details).  Clinical Course   Patient complaining of worsening anal itching since February. Visual inspection of rectum unremarkable. Provided prescription for Anusol topical cream, recommended Sitz baths (since warm baths provided relief), and OTC ibuprofen as needed. Patient has PCP appointment in 2 weeks, advised to f/u sooner if symptoms persist. Return precautions to ED provided for new or worsening symptoms.  Final Clinical Impressions(s) / ED Diagnoses   Final diagnoses:  Pruritus    New Prescriptions Discharge Medication List as of 05/15/2016  4:30 PM    START taking these medications   Details  hydrocortisone (ANUSOL-HC) 2.5 % rectal cream Apply rectally 2 times daily, Print         Ivonne AndrewDaryl F de CoolVillier II, GeorgiaPA 05/15/16 1649    Margarita Grizzleanielle Ray, MD 05/18/16 [phone]

## 2016-05-15 NOTE — ED Triage Notes (Signed)
Pt comes in to ED w/ c/o itching in the rectum since February. Pt states itching started in Feb, and she noticed rectal swelling since approx April. Pt has tried OTC creams and medication w/ no relief. Pt denies any vaginal itching or discharge. Denies at home enemas, anal intercourse, or introducing any foreign objects to rectum. Denies changes in bowel pattern or consistency. Denies rectal bleeding. Pt AOx4.

## 2017-07-21 ENCOUNTER — Encounter (HOSPITAL_COMMUNITY): Payer: Self-pay | Admitting: Emergency Medicine

## 2017-07-21 ENCOUNTER — Emergency Department (HOSPITAL_COMMUNITY)
Admission: EM | Admit: 2017-07-21 | Discharge: 2017-07-21 | Disposition: A | Discharge status: Home / Self Care | Payer: Medicaid Other | Attending: Emergency Medicine | Admitting: Emergency Medicine

## 2017-07-21 ENCOUNTER — Emergency Department (HOSPITAL_COMMUNITY): Payer: Medicaid Other

## 2017-07-21 DIAGNOSIS — R0789 Other chest pain: Secondary | ICD-10-CM | POA: Diagnosis not present

## 2017-07-21 DIAGNOSIS — Z79899 Other long term (current) drug therapy: Secondary | ICD-10-CM | POA: Diagnosis not present

## 2017-07-21 DIAGNOSIS — R079 Chest pain, unspecified: Secondary | ICD-10-CM | POA: Diagnosis present

## 2017-07-21 LAB — BASIC METABOLIC PANEL
ANION GAP: 8 (ref 5–15)
BUN: 6 mg/dL (ref 6–20)
CALCIUM: 8.6 mg/dL — AB (ref 8.9–10.3)
CO2: 20 mmol/L — ABNORMAL LOW (ref 22–32)
CREATININE: 0.64 mg/dL (ref 0.44–1.00)
Chloride: 105 mmol/L (ref 101–111)
GFR calc Af Amer: >60 mL/min (ref >60)
GLUCOSE: 93 mg/dL (ref 65–99)
Potassium: 4 mmol/L (ref 3.5–5.1)
Sodium: 133 mmol/L — ABNORMAL LOW (ref 135–145)

## 2017-07-21 LAB — CBC
HCT: 38.6 % (ref 36.0–46.0)
HEMOGLOBIN: 13.1 g/dL (ref 12.0–15.0)
MCH: 30.3 pg (ref 26.0–34.0)
MCHC: 33.9 g/dL (ref 30.0–36.0)
MCV: 89.1 fL (ref 78.0–100.0)
PLATELETS: 255 10*3/uL (ref 150–400)
RBC: 4.33 MIL/uL (ref 3.87–5.11)
RDW: 12 % (ref 11.5–15.5)
WBC: 8 10*3/uL (ref 4.0–10.5)

## 2017-07-21 LAB — I-STAT BETA HCG BLOOD, ED (MC, WL, AP ONLY): I-stat hCG, quantitative: <5 m[IU]/mL (ref <5)

## 2017-07-21 LAB — I-STAT TROPONIN, ED
Troponin i, poc: 0 ng/mL (ref 0.00–0.08)
Troponin i, poc: 0 ng/mL (ref 0.00–0.08)

## 2017-07-21 LAB — D-DIMER, QUANTITATIVE: D-Dimer, Quant: <0.27 ug/mL-FEU (ref 0.00–0.50)

## 2017-07-21 MED ORDER — HYDROMORPHONE HCL 1 MG/ML IJ SOLN
1.0000 mg | Freq: Once | INTRAMUSCULAR | Status: AC
  Administered 2017-07-21: 1 mg via INTRAVENOUS
  Filled 2017-07-21: qty 1

## 2017-07-21 MED ORDER — ONDANSETRON HCL 4 MG/2ML IJ SOLN
4.0000 mg | Freq: Once | INTRAMUSCULAR | Status: AC
  Administered 2017-07-21: 4 mg via INTRAVENOUS
  Filled 2017-07-21: qty 2

## 2017-07-21 MED ORDER — NAPROXEN 500 MG PO TABS: 500.0000 mg | ORAL_TABLET | Freq: Two times a day (BID) | ORAL | 0 refills | Status: DC

## 2017-07-21 MED ORDER — SODIUM CHLORIDE 0.9 % IV SOLN
INTRAVENOUS | Status: DC
  Administered 2017-07-21: 20:00:00 via INTRAVENOUS

## 2017-07-21 MED ORDER — SODIUM CHLORIDE 0.9 % IV BOLUS (SEPSIS)
500.0000 mL | Freq: Once | INTRAVENOUS | Status: AC
  Administered 2017-07-21: 500 mL via INTRAVENOUS

## 2017-07-21 MED ORDER — ASPIRIN 81 MG PO CHEW
324.0000 mg | CHEWABLE_TABLET | Freq: Once | ORAL | Status: AC
  Administered 2017-07-21: 324 mg via ORAL
  Filled 2017-07-21: qty 4

## 2017-07-21 NOTE — Discharge Instructions (Signed)
Workup for the chest pain without any acute findings.  No evidence of any collapse lung any concern for blood clots in the lung.  No evidence of the heart being affected by it.  Seems to be consistent with chest wall pain.  Take the Naprosyn as directed for the next 7 days.  Return for any new or worse symptoms.

## 2017-07-21 NOTE — ED Notes (Signed)
Patient left at this time with all belongings. 

## 2017-07-21 NOTE — ED Notes (Signed)
Patient transported to X-ray 

## 2017-07-21 NOTE — ED Provider Notes (Signed)
[facility]   CSN: [id number] Arrival date & time: 07/21/17  1729     History   Chief Complaint Chief Complaint  Patient presents with  . Chest Pain    HPI Pamela Buchanan is a 26 y.o. female.  Patient come in with concerns for centralized chest pain radiating to the left arm.  Actually more to the left side of the chest started onset when she was cooking.  Pain 8 out of 10.  Started at 1700.  Made worse with a little bit of breathing.  Made worse by taking a deep breath.  No nausea no vomiting questionable shortness of breath.  No history of prior similar symptoms.      Past Medical History:  Diagnosis Date  . No pertinent past medical history     Patient Active Problem List   Diagnosis Date Noted  . Rh alloimmunization, maternal, antepartum 06/23/2011    Past Surgical History:  Procedure Laterality Date  . DILATION AND CURETTAGE OF UTERUS      OB History    Gravida Para Term Preterm AB Living   2 1 1   1 1    SAB TAB Ectopic Multiple Live Births   1       1       Home Medications    Prior to Admission medications   Medication Sig Start Date End Date Taking? Authorizing Provider  diphenhydrAMINE (BENADRYL) 25 MG tablet Take 1 tablet (25 mg total) by mouth every 6 (six) hours. 06/20/15   Szekalski, Kaitlyn, PA-C  HYDROcodone-homatropine (HYCODAN) 5-1.5 MG/5ML syrup Take 5 mLs by mouth every 6 (six) hours as needed. 06/20/15   Emilia BeckSzekalski, Kaitlyn, PA-C  hydrocortisone (ANUSOL-HC) 2.5 % rectal cream Apply rectally 2 times daily 05/15/16   de Villier, Daryl F II, PA  ibuprofen (ADVIL,MOTRIN) 800 MG tablet Take 1 tablet (800 mg total) by mouth 3 (three) times daily. 06/20/15   Emilia BeckSzekalski, Kaitlyn, PA-C  levonorgestrel (MIRENA) 20 MCG/24HR IUD 1 each by Intrauterine route once.    [provider]  naproxen (NAPROSYN) 500 MG tablet Take 1 tablet (500 mg total) by mouth 2 (two) times daily. 07/21/17   Vanetta MuldersZackowski,  Dewitt Judice, MD    Family History No family history on file.  Social History Social History   Tobacco Use  . Smoking status: Never Smoker  . Smokeless tobacco: Never Used  Substance Use Topics  . Alcohol use: Yes    Comment: occasional socially  . Drug use: No     Allergies   Patient has no known allergies.   Review of Systems Review of Systems  Constitutional: Negative for fever.  HENT: Negative for congestion.   Eyes: Negative for redness.  Respiratory: Positive for shortness of breath.   Cardiovascular: Positive for chest pain.  Gastrointestinal: Negative for abdominal pain.  Genitourinary: Negative for dysuria.  Musculoskeletal: Negative for back pain.  Skin: Negative for rash.  Neurological: Negative for headaches.  Hematological: Does not bruise/bleed easily.  Psychiatric/Behavioral: Negative for confusion.     Physical Exam Updated Vital Signs BP 105/68   Pulse 68   Temp 98.2 F (36.8 C)   Resp 12   Ht 1.549 m (5\' 1" )   Wt 83.5 kg (184 lb)   SpO2 100%   BMI 34.77 kg/m   Physical Exam  Constitutional: She is oriented to person, place, and time. She appears well-developed and well-nourished. No distress.  HENT:  Head: Normocephalic and atraumatic.  Mouth/Throat: Oropharynx is clear and moist.  Eyes: Conjunctivae and EOM are normal. Pupils are equal, round, and reactive to light.  Neck: Normal range of motion. Neck supple.  Cardiovascular: Normal rate and regular rhythm.  Pulmonary/Chest: Effort normal and breath sounds normal. She exhibits tenderness.  Tenderness to palpation to the left side of chest.  Abdominal: Bowel sounds are normal. There is no tenderness.  Musculoskeletal: Normal range of motion.  Neurological: She is alert and oriented to person, place, and time. No cranial nerve deficit or sensory deficit. She exhibits normal muscle tone.  Skin: Skin is warm.  Nursing note and vitals reviewed.    ED Treatments / Results  Labs (all labs  ordered are listed, but only abnormal results are displayed) Labs Reviewed  BASIC METABOLIC PANEL - Abnormal; Notable for the following components:      Result Value   Sodium 133 (*)    CO2 20 (*)    Calcium 8.6 (*)    All other components within normal limits  CBC  D-DIMER, QUANTITATIVE (NOT [facility])  I-STAT TROPONIN, ED  I-STAT BETA HCG BLOOD, ED ([facility], [facility], [facility] ONLY)  I-STAT TROPONIN, ED    EKG  EKG Interpretation  Date/Time:  Saturday July 21 2017 17:37:20 EST Ventricular Rate:  71 PR Interval:  176 QRS Duration: 72 QT Interval:  390 QTC Calculation: 423 R Axis:   49 Text Interpretation:  Normal sinus rhythm Normal ECG Confirmed by Vanetta MuldersZackowski, Adaleigh Warf [phone] on 07/21/2017 7:07:20 PM       Radiology Dg Chest 2 View  Result Date: 07/21/2017 CLINICAL DATA:  Left-sided chest pain. EXAM: CHEST  2 VIEW COMPARISON:  June 20, 2015 FINDINGS: The heart, hila, mediastinum, lungs, and pleura are normal. No cause for left-sided chest pain identified. IMPRESSION: No active cardiopulmonary disease. Electronically Signed   By: Gerome Samavid  Williams III M.D   On: 07/21/2017 18:24    Procedures Procedures (including critical care time)  Medications Ordered in ED Medications  0.9 %  sodium chloride infusion ( Intravenous New Bag/Given 07/21/17 1947)  sodium chloride 0.9 % bolus 500 mL (0 mLs Intravenous Stopped 07/21/17 2033)  ondansetron (ZOFRAN) injection 4 mg (4 mg Intravenous Given 07/21/17 1947)  HYDROmorphone (DILAUDID) injection 1 mg (1 mg Intravenous Given 07/21/17 1948)  aspirin chewable tablet 324 mg (324 mg Oral Given 07/21/17 1947)     Initial Impression / Assessment and Plan / ED Course  I have reviewed the triage vital signs and the nursing notes.  Pertinent labs & imaging results that were available during my care of the patient were reviewed by me and considered in my medical decision making (see chart for details).    Workup for the chest pain does seem to be chest wall  in nature.  Troponins were negative x2 d-dimer was negative.  No concerns for pulmonary embolus.  Chest x-ray showed no evidence of pneumothorax or any acute abnormalities.  Patient will be treated for chest wall pain with Naprosyn.   Final Clinical Impressions(s) / ED Diagnoses   Final diagnoses:  Chest wall pain    ED Discharge Orders        Ordered    naproxen (NAPROSYN) 500 MG tablet  2 times daily     07/21/17 2238       Vanetta MuldersZackowski, Eladia Frame, MD 07/22/17 2000

## 2017-07-21 NOTE — ED Notes (Signed)
Attempted IV access in R ac x1 without success. [facility]

## 2017-07-21 NOTE — ED Triage Notes (Signed)
Pt states new onset centralized chest pain radiating into left arm that started while she was cooking. Pain 8/10.

## 2018-02-05 ENCOUNTER — Emergency Department (HOSPITAL_COMMUNITY)
Admission: EM | Admit: 2018-02-05 | Discharge: 2018-02-05 | Disposition: A | Discharge status: Home / Self Care | Payer: Medicaid Other | Attending: Emergency Medicine | Admitting: Emergency Medicine

## 2018-02-05 ENCOUNTER — Emergency Department (HOSPITAL_COMMUNITY): Payer: Medicaid Other

## 2018-02-05 ENCOUNTER — Other Ambulatory Visit: Payer: Self-pay

## 2018-02-05 ENCOUNTER — Encounter (HOSPITAL_COMMUNITY): Payer: Self-pay | Admitting: Emergency Medicine

## 2018-02-05 DIAGNOSIS — R05 Cough: Secondary | ICD-10-CM | POA: Diagnosis present

## 2018-02-05 DIAGNOSIS — J069 Acute upper respiratory infection, unspecified: Secondary | ICD-10-CM | POA: Diagnosis not present

## 2018-02-05 DIAGNOSIS — B9789 Other viral agents as the cause of diseases classified elsewhere: Secondary | ICD-10-CM | POA: Diagnosis not present

## 2018-02-05 DIAGNOSIS — Z79899 Other long term (current) drug therapy: Secondary | ICD-10-CM | POA: Insufficient documentation

## 2018-02-05 MED ORDER — ALBUTEROL SULFATE HFA 108 (90 BASE) MCG/ACT IN AERS: 1.0000 | INHALATION_SPRAY | Freq: Four times a day (QID) | RESPIRATORY_TRACT | 0 refills | Status: DC | PRN

## 2018-02-05 MED ORDER — BENZONATATE 100 MG PO CAPS: 100.0000 mg | ORAL_CAPSULE | Freq: Three times a day (TID) | ORAL | 0 refills | Status: DC

## 2018-02-05 NOTE — ED Notes (Signed)
Patient transported to X-ray 

## 2018-02-05 NOTE — Discharge Instructions (Signed)
Please read and follow all provided instructions.  Your diagnoses today include:  1. Viral URI with cough     You appear to have an upper respiratory infection (URI). An upper respiratory tract infection, or cold, is a viral infection of the air passages leading to the lungs. It should improve gradually after 5-7 days. You may have a lingering cough that lasts for 2- 4 weeks after the infection.  Tests performed today include: Vital signs. See below for your results today.  Your chest x-ray demonstrates no signs of pneumonia.  Medications prescribed:   Take any prescribed medications only as directed. Treatment for your infection is aimed at treating the symptoms. There are no medications, such as antibiotics, that will cure your infection.   Since you have a previous reaction to Robitussin, it is unclear whether you reacted to dextromethorphan or guaifenesin.  These are the active ingredients in many Robitussin formulations.  Other over-the-counter medications that may help your symptoms include pseudoephedrine or phenylephrine.  These are decongestions that will reduce the secretions in your sinuses.  Always refer to package directions for dosing.  Lloyd Hugereil med sinus rinse packages are also an option to reduce your congestion.   for your cough, I prescribed Tessalon, which is a cough suppressant.  This medication must be kept away from children, as it can be toxic to them and overdose.  You are also prescribed albuterol, which is an inhaler.  Please read the attached instructions on how to perform an inhaler.  You will take 1 to 2 puffs every 4-6 hours as needed for cough. PhotoConference.nohttps://www.cdc.gov/asthma/inhaler_video/default.htm  Home care instructions:  Follow any educational materials contained in this packet.   Your illness is contagious and can be spread to others, especially during the first 3 or 4 days. It cannot be cured by antibiotics or other medicines. Take basic precautions such as  washing your hands often, covering your mouth when you cough or sneeze, and avoiding public places where you could spread your illness to others.   Please continue drinking plenty of fluids.  Use over-the-counter medicines as needed as directed on packaging for symptom relief.  You may also use ibuprofen or tylenol as directed on packaging for pain or fever.  Do not take multiple medicines containing Tylenol or acetaminophen to avoid taking too much of this medication.  Follow-up instructions: Please follow-up with your primary care provider in the next 3 days for further evaluation of your symptoms if you are not feeling better.   Return instructions:  Please return to the Emergency Department if you experience worsening symptoms.  RETURN IMMEDIATELY IF you develop shortness of breath, chest pain, confusion or altered mental status, a new rash, become dizzy, faint, or poorly responsive, or are unable to be cared for at home. Please return if you have persistent vomiting and cannot keep down fluids or develop a fever that is not controlled by tylenol or motrin.   Please return if you have any other emergent concerns.  Additional Information:  Your vital signs today were: BP 97/61    Pulse 99    Temp 98 F (36.7 C) (Oral)    Resp 18    LMP  (LMP Unknown)    SpO2 98%  If your blood pressure (BP) was elevated above 135/85 this visit, please have this repeated by your doctor within one month. --------------

## 2018-02-05 NOTE — ED Provider Notes (Signed)
[facility]   CSN: [id number] Arrival date & time: 02/05/18  0554     History   Chief Complaint Chief Complaint  Patient presents with  . URI    HPI Pamela Buchanan is a 27 y.o. female.  HPI   Patient is a 27 year old female with no significant past medical history presenting for cough and congestion.  Patient reports that her symptoms began 5 days ago.  Patient reports symptoms initially began with a sore throat which has subsequently resolved, however she has had a progressively worsening nonproductive cough.  Patient reports that she felt chilled the first couple days of her illness but denies fever present.  Associated symptoms include a sensation of ear fullness in the left.Patient denies any shortness of breath, chest pain.  Patient denies any purulent nasal discharge.  No history of chronic lung diseases or immunocompromise status.  Patient has been taking over-the-counter solutions including DayQuil without full relief.    Past Medical History:  Diagnosis Date  . No pertinent past medical history     Patient Active Problem List   Diagnosis Date Noted  . Rh alloimmunization, maternal, antepartum 06/23/2011    Past Surgical History:  Procedure Laterality Date  . DILATION AND CURETTAGE OF UTERUS       OB History    Gravida  2   Para  1   Term  1   Preterm      AB  1   Living  1     SAB  1   TAB      Ectopic      Multiple      Live Births  1            Home Medications    Prior to Admission medications   Medication Sig Start Date End Date Taking? Authorizing Provider  diphenhydrAMINE (BENADRYL) 25 MG tablet Take 1 tablet (25 mg total) by mouth every 6 (six) hours. 06/20/15   Szekalski, Kaitlyn, PA-C  HYDROcodone-homatropine (HYCODAN) 5-1.5 MG/5ML syrup Take 5 mLs by mouth every 6 (six) hours as needed. 06/20/15   Emilia BeckSzekalski, Kaitlyn, PA-C  hydrocortisone (ANUSOL-HC) 2.5 % rectal cream Apply  rectally 2 times daily 05/15/16   de Villier, Daryl F II, PA  ibuprofen (ADVIL,MOTRIN) 800 MG tablet Take 1 tablet (800 mg total) by mouth 3 (three) times daily. 06/20/15   Emilia BeckSzekalski, Kaitlyn, PA-C  levonorgestrel (MIRENA) 20 MCG/24HR IUD 1 each by Intrauterine route once.    [provider]  naproxen (NAPROSYN) 500 MG tablet Take 1 tablet (500 mg total) by mouth 2 (two) times daily. 07/21/17   Vanetta MuldersZackowski, Scott, MD    Family History No family history on file.  Social History Social History   Tobacco Use  . Smoking status: Never Smoker  . Smokeless tobacco: Never Used  Substance Use Topics  . Alcohol use: Yes    Comment: occasional socially  . Drug use: No     Allergies   Patient has no known allergies.   Review of Systems Review of Systems  Constitutional: Negative for chills and fever.  HENT: Positive for congestion and rhinorrhea. Negative for sore throat and trouble swallowing.   Respiratory: Positive for cough. Negative for shortness of breath and wheezing.      Physical Exam Updated Vital Signs BP (!) 124/49   Pulse 79   Temp 98 F (36.7 C) (Oral)   Resp 15   LMP  (LMP Unknown)  SpO2 100%   Physical Exam  Constitutional: She appears well-developed and well-nourished. No distress.  Sitting comfortably in bed.  HENT:  Head: Normocephalic and atraumatic.  Bilateral tympanic membranes without erythema or effusion.  Eyes: Conjunctivae are normal. Right eye exhibits no discharge. Left eye exhibits no discharge.  EOMs normal to gross examination.  Neck: Normal range of motion.  Cardiovascular: Normal rate, regular rhythm and normal heart sounds.  Pulmonary/Chest: Effort normal and breath sounds normal.  Normal respiratory effort. Patient converses comfortably. No audible wheeze or stridor. Dry cough noted on examination.  Abdominal: She exhibits no distension.  Musculoskeletal: Normal range of motion.  Neurological: She is alert.  Cranial nerves intact  to gross observation. Patient moves extremities without difficulty.  Skin: Skin is warm and dry. She is not diaphoretic.  Psychiatric: She has a normal mood and affect. Her behavior is normal. Judgment and thought content normal.  Nursing note and vitals reviewed.    ED Treatments / Results  Labs (all labs ordered are listed, but only abnormal results are displayed) Labs Reviewed - No data to display  EKG None  Radiology Dg Chest 2 View  Result Date: 02/05/2018 CLINICAL DATA:  Nonproductive cough, body aches, headache, and ear congestion for the past 5 days. Nonsmoker. EXAM: CHEST - 2 VIEW COMPARISON:  PA and lateral chest x-ray of July 21, 2017 FINDINGS: The lungs are well-expanded and clear. The heart and pulmonary vascularity are normal. The mediastinum is normal in width. There is no pleural effusion. The bony thorax is unremarkable. IMPRESSION: There is no active cardiopulmonary disease. Electronically Signed   By: David  [place] M.D.   On: 02/05/2018 07:13    Procedures Procedures (including critical care time)  Medications Ordered in ED Medications - No data to display   Initial Impression / Assessment and Plan / ED Course  I have reviewed the triage vital signs and the nursing notes.  Pertinent labs & imaging results that were available during my care of the patient were reviewed by me and considered in my medical decision making (see chart for details).     Patient with symptoms consistent with a viral syndrome. Vitals are stable, no fever, and no hypoxia or tachypnea. No signs of dehydration. Lung exam normal, no signs of pneumonia and chest xray negative for acute cardiopulmonary abnormality, reviewed by me. Supportive therapy indicated with return if symptoms worsen.  Albuterol inhaler dispensed as well as Tessalon Perles for symptomatic relief. Patient with allergy to guaifenesin, and instructed on appropriate over-the-counter brands to use to avoid this medication.   Patient instructed on how to use albuterol inhaler.   Final Clinical Impressions(s) / ED Diagnoses   Final diagnoses:  Viral URI with cough    ED Discharge Orders        Ordered    albuterol (PROVENTIL HFA;VENTOLIN HFA) 108 (90 Base) MCG/ACT inhaler  Every 6 hours PRN     02/05/18 0728    benzonatate (TESSALON) 100 MG capsule  Every 8 hours     02/05/18 0728       Elisha Ponder, PA-C 02/05/18 0745    Shon Baton, MD 02/05/18 [phone]

## 2018-02-05 NOTE — ED Triage Notes (Signed)
Pt reports dry cough, headache, body aches and bilateral ear fullness since Thursday. Has taken otc cough suppressants as well as tylenol and motrin w/o relief. resp e/u, nad

## 2018-09-17 DIAGNOSIS — J069 Acute upper respiratory infection, unspecified: Secondary | ICD-10-CM | POA: Diagnosis not present

## 2018-10-18 DIAGNOSIS — Z1388 Encounter for screening for disorder due to exposure to contaminants: Secondary | ICD-10-CM | POA: Diagnosis not present

## 2018-10-18 DIAGNOSIS — Z113 Encounter for screening for infections with a predominantly sexual mode of transmission: Secondary | ICD-10-CM | POA: Diagnosis not present

## 2018-10-18 DIAGNOSIS — N760 Acute vaginitis: Secondary | ICD-10-CM | POA: Diagnosis not present

## 2018-10-18 DIAGNOSIS — Z3009 Encounter for other general counseling and advice on contraception: Secondary | ICD-10-CM | POA: Diagnosis not present

## 2018-10-18 DIAGNOSIS — Z0389 Encounter for observation for other suspected diseases and conditions ruled out: Secondary | ICD-10-CM | POA: Diagnosis not present

## 2018-10-31 DIAGNOSIS — Z3041 Encounter for surveillance of contraceptive pills: Secondary | ICD-10-CM | POA: Diagnosis not present

## 2018-10-31 DIAGNOSIS — Z3200 Encounter for pregnancy test, result unknown: Secondary | ICD-10-CM | POA: Diagnosis not present

## 2018-10-31 DIAGNOSIS — Z3009 Encounter for other general counseling and advice on contraception: Secondary | ICD-10-CM | POA: Diagnosis not present

## 2019-02-28 DIAGNOSIS — N921 Excessive and frequent menstruation with irregular cycle: Secondary | ICD-10-CM | POA: Diagnosis not present

## 2019-02-28 DIAGNOSIS — N926 Irregular menstruation, unspecified: Secondary | ICD-10-CM | POA: Diagnosis not present

## 2019-02-28 DIAGNOSIS — N760 Acute vaginitis: Secondary | ICD-10-CM | POA: Diagnosis not present

## 2019-02-28 DIAGNOSIS — Z114 Encounter for screening for human immunodeficiency virus [HIV]: Secondary | ICD-10-CM | POA: Diagnosis not present

## 2019-02-28 DIAGNOSIS — Z118 Encounter for screening for other infectious and parasitic diseases: Secondary | ICD-10-CM | POA: Diagnosis not present

## 2019-03-28 DIAGNOSIS — Z0000 Encounter for general adult medical examination without abnormal findings: Secondary | ICD-10-CM | POA: Diagnosis not present

## 2019-03-28 DIAGNOSIS — Z13220 Encounter for screening for lipoid disorders: Secondary | ICD-10-CM | POA: Diagnosis not present

## 2019-03-28 DIAGNOSIS — Z131 Encounter for screening for diabetes mellitus: Secondary | ICD-10-CM | POA: Diagnosis not present

## 2019-03-28 DIAGNOSIS — R5383 Other fatigue: Secondary | ICD-10-CM | POA: Diagnosis not present

## 2019-03-28 DIAGNOSIS — E559 Vitamin D deficiency, unspecified: Secondary | ICD-10-CM | POA: Diagnosis not present

## 2019-03-31 DIAGNOSIS — A09 Infectious gastroenteritis and colitis, unspecified: Secondary | ICD-10-CM | POA: Diagnosis not present

## 2019-06-14 DIAGNOSIS — J209 Acute bronchitis, unspecified: Secondary | ICD-10-CM | POA: Diagnosis not present

## 2019-06-17 DIAGNOSIS — Z20828 Contact with and (suspected) exposure to other viral communicable diseases: Secondary | ICD-10-CM | POA: Diagnosis not present

## 2019-06-17 DIAGNOSIS — R05 Cough: Secondary | ICD-10-CM | POA: Diagnosis not present

## 2019-06-17 DIAGNOSIS — J189 Pneumonia, unspecified organism: Secondary | ICD-10-CM | POA: Diagnosis not present

## 2019-06-30 ENCOUNTER — Other Ambulatory Visit: Payer: Self-pay

## 2019-06-30 DIAGNOSIS — Z021 Encounter for pre-employment examination: Secondary | ICD-10-CM

## 2019-06-30 NOTE — Progress Notes (Signed)
  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  Collected using [employer] of [employer]ust[employer] form on [facility] account number [id number]. Specimen # [id number].  AMD

## 2019-07-31 ENCOUNTER — Other Ambulatory Visit: Payer: Self-pay

## 2019-07-31 ENCOUNTER — Ambulatory Visit (HOSPITAL_COMMUNITY)
Admission: EM | Admit: 2019-07-31 | Discharge: 2019-07-31 | Disposition: A | Discharge status: Home / Self Care | Payer: Medicaid Other | Attending: Family Medicine | Admitting: Family Medicine

## 2019-07-31 ENCOUNTER — Telehealth (HOSPITAL_COMMUNITY): Payer: Self-pay

## 2019-07-31 ENCOUNTER — Encounter (HOSPITAL_COMMUNITY): Payer: Self-pay

## 2019-07-31 DIAGNOSIS — S161XXA Strain of muscle, fascia and tendon at neck level, initial encounter: Secondary | ICD-10-CM

## 2019-07-31 MED ORDER — CYCLOBENZAPRINE HCL 10 MG PO TABS: 5.0000 mg | ORAL_TABLET | Freq: Every day | ORAL | 0 refills | Status: DC

## 2019-07-31 MED ORDER — IBUPROFEN 600 MG PO TABS: 600.0000 mg | ORAL_TABLET | Freq: Four times a day (QID) | ORAL | 0 refills | Status: DC | PRN

## 2019-07-31 NOTE — Telephone Encounter (Signed)
Pt states medications were sent to the wrong pharmacy. Pt pharmacy changed to [facility].
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
## 2019-07-31 NOTE — Discharge Instructions (Addendum)
Take the medication as needed Rest, Ice/Heat Follow up as needed for continued or worsening symptoms
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
## 2019-08-04 ENCOUNTER — Ambulatory Visit (HOSPITAL_COMMUNITY)
Admission: EM | Admit: 2019-08-04 | Discharge: 2019-08-04 | Disposition: A | Discharge status: Home / Self Care | Payer: Medicaid Other | Attending: Internal Medicine | Admitting: Internal Medicine

## 2019-08-04 ENCOUNTER — Encounter (HOSPITAL_COMMUNITY): Payer: Self-pay | Admitting: Emergency Medicine

## 2019-08-04 ENCOUNTER — Other Ambulatory Visit: Payer: Self-pay

## 2019-08-04 DIAGNOSIS — M542 Cervicalgia: Secondary | ICD-10-CM
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
## 2019-11-04 DIAGNOSIS — K219 Gastro-esophageal reflux disease without esophagitis: Secondary | ICD-10-CM | POA: Diagnosis not present

## 2019-11-27 DIAGNOSIS — Z113 Encounter for screening for infections with a predominantly sexual mode of transmission: Secondary | ICD-10-CM | POA: Diagnosis not present

## 2019-11-27 DIAGNOSIS — Z0389 Encounter for observation for other suspected diseases and conditions ruled out: Secondary | ICD-10-CM | POA: Diagnosis not present

## 2019-11-27 DIAGNOSIS — Z3009 Encounter for other general counseling and advice on contraception: Secondary | ICD-10-CM | POA: Diagnosis not present

## 2019-11-27 DIAGNOSIS — Z1388 Encounter for screening for disorder due to exposure to contaminants: Secondary | ICD-10-CM | POA: Diagnosis not present

## 2019-12-03 DIAGNOSIS — Z3041 Encounter for surveillance of contraceptive pills: Secondary | ICD-10-CM | POA: Diagnosis not present

## 2019-12-03 DIAGNOSIS — Z01419 Encounter for gynecological examination (general) (routine) without abnormal findings: Secondary | ICD-10-CM | POA: Diagnosis not present

## 2019-12-03 DIAGNOSIS — Z113 Encounter for screening for infections with a predominantly sexual mode of transmission: Secondary | ICD-10-CM | POA: Diagnosis not present

## 2020-01-29 DIAGNOSIS — H6992 Unspecified Eustachian tube disorder, left ear: Secondary | ICD-10-CM | POA: Diagnosis not present

## 2020-05-05 DIAGNOSIS — Z3009 Encounter for other general counseling and advice on contraception: Secondary | ICD-10-CM | POA: Diagnosis not present

## 2020-05-25 DIAGNOSIS — Z3041 Encounter for surveillance of contraceptive pills: Secondary | ICD-10-CM | POA: Diagnosis not present

## 2020-05-25 DIAGNOSIS — Z3009 Encounter for other general counseling and advice on contraception: Secondary | ICD-10-CM | POA: Diagnosis not present

## 2020-10-29 DIAGNOSIS — R0602 Shortness of breath: Secondary | ICD-10-CM | POA: Diagnosis not present

## 2020-10-29 DIAGNOSIS — R059 Cough, unspecified: Secondary | ICD-10-CM | POA: Diagnosis not present

## 2020-10-29 DIAGNOSIS — Z20822 Contact with and (suspected) exposure to covid-19: Secondary | ICD-10-CM | POA: Diagnosis not present

## 2020-11-12 DIAGNOSIS — Z114 Encounter for screening for human immunodeficiency virus [HIV]: Secondary | ICD-10-CM | POA: Diagnosis not present

## 2020-11-12 DIAGNOSIS — Z113 Encounter for screening for infections with a predominantly sexual mode of transmission: Secondary | ICD-10-CM | POA: Diagnosis not present

## 2020-11-12 DIAGNOSIS — N760 Acute vaginitis: Secondary | ICD-10-CM | POA: Diagnosis not present

## 2020-11-12 DIAGNOSIS — Z3200 Encounter for pregnancy test, result unknown: Secondary | ICD-10-CM | POA: Diagnosis not present

## 2021-02-23 ENCOUNTER — Emergency Department (HOSPITAL_COMMUNITY): Payer: 59

## 2021-02-23 ENCOUNTER — Emergency Department (HOSPITAL_COMMUNITY)
Admission: EM | Admit: 2021-02-23 | Discharge: 2021-02-23 | Disposition: A | Discharge status: Home / Self Care | Payer: 59 | Attending: Emergency Medicine | Admitting: Emergency Medicine

## 2021-02-23 ENCOUNTER — Other Ambulatory Visit: Payer: Self-pay

## 2021-02-23 DIAGNOSIS — G43109 Migraine with aura, not intractable, without status migrainosus: Secondary | ICD-10-CM | POA: Diagnosis not present

## 2021-02-23 DIAGNOSIS — F449 Dissociative and conversion disorder, unspecified: Secondary | ICD-10-CM | POA: Diagnosis not present

## 2021-02-23 DIAGNOSIS — I639 Cerebral infarction, unspecified: Secondary | ICD-10-CM | POA: Diagnosis not present

## 2021-02-23 DIAGNOSIS — R531 Weakness: Secondary | ICD-10-CM | POA: Insufficient documentation

## 2021-02-23 DIAGNOSIS — R519 Headache, unspecified: Secondary | ICD-10-CM | POA: Diagnosis present

## 2021-02-23 DIAGNOSIS — D72829 Elevated white blood cell count, unspecified: Secondary | ICD-10-CM | POA: Insufficient documentation

## 2021-02-23 DIAGNOSIS — Y900 Blood alcohol level of less than 20 mg/100 ml: Secondary | ICD-10-CM | POA: Diagnosis not present

## 2021-02-23 DIAGNOSIS — Z20822 Contact with and (suspected) exposure to covid-19: Secondary | ICD-10-CM | POA: Insufficient documentation

## 2021-02-23 DIAGNOSIS — R471 Dysarthria and anarthria: Secondary | ICD-10-CM

## 2021-02-23 LAB — CBC
HCT: 41.6 % (ref 36.0–46.0)
Hemoglobin: 13.6 g/dL (ref 12.0–15.0)
MCH: 30 pg (ref 26.0–34.0)
MCHC: 32.7 g/dL (ref 30.0–36.0)
MCV: 91.8 fL (ref 80.0–100.0)
Platelets: 265 10*3/uL (ref 150–400)
RBC: 4.53 MIL/uL (ref 3.87–5.11)
RDW: 12.5 % (ref 11.5–15.5)
WBC: 14.4 10*3/uL — ABNORMAL HIGH (ref 4.0–10.5)
nRBC: 0 % (ref 0.0–0.2)

## 2021-02-23 LAB — I-STAT CHEM 8, ED
BUN: 12 mg/dL (ref 6–20)
Calcium, Ion: 1.09 mmol/L — ABNORMAL LOW (ref 1.15–1.40)
Chloride: 106 mmol/L (ref 98–111)
Creatinine, Ser: 0.7 mg/dL (ref 0.44–1.00)
Glucose, Bld: 105 mg/dL — ABNORMAL HIGH (ref 70–99)
HCT: 41 % (ref 36.0–46.0)
Hemoglobin: 13.9 g/dL (ref 12.0–15.0)
Potassium: 3.7 mmol/L (ref 3.5–5.1)
Sodium: 137 mmol/L (ref 135–145)
TCO2: 21 mmol/L — ABNORMAL LOW (ref 22–32)

## 2021-02-23 LAB — COMPREHENSIVE METABOLIC PANEL
ALT: 15 U/L (ref 0–44)
AST: 25 U/L (ref 15–41)
Albumin: 3.9 g/dL (ref 3.5–5.0)
Alkaline Phosphatase: 65 U/L (ref 38–126)
Anion gap: 10 (ref 5–15)
BUN: 9 mg/dL (ref 6–20)
CO2: 19 mmol/L — ABNORMAL LOW (ref 22–32)
Calcium: 9.5 mg/dL (ref 8.9–10.3)
Chloride: 106 mmol/L (ref 98–111)
Creatinine, Ser: 0.77 mg/dL (ref 0.44–1.00)
GFR, Estimated: >60 mL/min (ref >60)
Glucose, Bld: 104 mg/dL — ABNORMAL HIGH (ref 70–99)
Potassium: 3.7 mmol/L (ref 3.5–5.1)
Sodium: 135 mmol/L (ref 135–145)
Total Bilirubin: 0.2 mg/dL — ABNORMAL LOW (ref 0.3–1.2)
Total Protein: 7.4 g/dL (ref 6.5–8.1)

## 2021-02-23 LAB — ETHANOL: Alcohol, Ethyl (B): <10 mg/dL (ref <10)

## 2021-02-23 LAB — DIFFERENTIAL
Abs Immature Granulocytes: 0.04 10*3/uL (ref 0.00–0.07)
Basophils Absolute: 0 10*3/uL (ref 0.0–0.1)
Basophils Relative: 0 %
Eosinophils Absolute: 0.1 10*3/uL (ref 0.0–0.5)
Eosinophils Relative: 1 %
Immature Granulocytes: 0 %
Lymphocytes Relative: 34 %
Lymphs Abs: 4.9 10*3/uL — ABNORMAL HIGH (ref 0.7–4.0)
Monocytes Absolute: 1 10*3/uL (ref 0.1–1.0)
Monocytes Relative: 7 %
Neutro Abs: 8.4 10*3/uL — ABNORMAL HIGH (ref 1.7–7.7)
Neutrophils Relative %: 58 %

## 2021-02-23 LAB — I-STAT BETA HCG BLOOD, ED (MC, WL, AP ONLY): I-stat hCG, quantitative: <5 m[IU]/mL (ref <5)

## 2021-02-23 LAB — APTT: aPTT: 29 seconds (ref 24–36)

## 2021-02-23 LAB — PROTIME-INR
INR: 1 (ref 0.8–1.2)
Prothrombin Time: 13.5 seconds (ref 11.4–15.2)

## 2021-02-23 LAB — RESP PANEL BY RT-PCR (FLU A&B, COVID) ARPGX2
Influenza A by PCR: NEGATIVE
Influenza B by PCR: NEGATIVE
SARS Coronavirus 2 by RT PCR: NEGATIVE

## 2021-02-23 LAB — CBG MONITORING, ED: Glucose-Capillary: 102 mg/dL — ABNORMAL HIGH (ref 70–99)

## 2021-02-23 MED ORDER — DIPHENHYDRAMINE HCL 50 MG/ML IJ SOLN
25.0000 mg | Freq: Once | INTRAMUSCULAR | Status: AC
  Administered 2021-02-23: 25 mg via INTRAVENOUS
  Filled 2021-02-23: qty 1

## 2021-02-23 MED ORDER — PROCHLORPERAZINE EDISYLATE 10 MG/2ML IJ SOLN
10.0000 mg | Freq: Once | INTRAMUSCULAR | Status: AC
  Administered 2021-02-23: 10 mg via INTRAVENOUS
  Filled 2021-02-23: qty 2

## 2021-02-23 MED ORDER — LACTATED RINGERS IV BOLUS
1000.0000 mL | Freq: Once | INTRAVENOUS | Status: AC
  Administered 2021-02-23: 1000 mL via INTRAVENOUS
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
## 2021-02-23 NOTE — ED Provider Notes (Signed)
[facility] EMERGENCY DEPARTMENT Provider Note   CSN: [id number] Arrival date & time: 02/23/21  0055     History Chief Complaint  Patient presents with   Code Stroke    Pamela Buchanan is a 30 y.o. female.  The history is provided by the patient.  She was brought in by ambulance as a code stroke.  She had gone to sleep about midnight and woke up with a bilateral occipital headache which was worse on the right, and a tight feeling in her throat.  She was having difficulty speaking and noted weakness of the right leg.  Her son called for an ambulance.  EMS initially noted gaze preference to the left, but that resolved en route.  She also had some slurred speech which has resolved.   Past Medical History:  Diagnosis Date   No pertinent past medical history     Patient Active Problem List   Diagnosis Date Noted   Rh alloimmunization, maternal, antepartum 06/23/2011    Past Surgical History:  Procedure Laterality Date   DILATION AND CURETTAGE OF UTERUS       OB History     Gravida  2   Para  1   Term  1   Preterm      AB  1   Living  1      SAB  1   IAB      Ectopic      Multiple      Live Births  1           Family History  Problem Relation Age of Onset   Healthy Mother    Hypertension Father     Social History   Tobacco Use   Smoking status: Never   Smokeless tobacco: Never  Vaping Use   Vaping Use: Never used  Substance Use Topics   Alcohol use: Yes    Comment: occasional socially   Drug use: No    Home Medications Prior to Admission medications   Medication Sig Start Date End Date Taking? Authorizing Provider  albuterol (PROVENTIL HFA;VENTOLIN HFA) 108 (90 Base) MCG/ACT inhaler Inhale 1-2 puffs into the lungs every 6 (six) hours as needed for wheezing or shortness of breath. 02/05/18   Aviva Kluver B, PA-C  cyclobenzaprine (FLEXERIL) 10 MG tablet Take 0.5 tablets (5 mg total) by mouth at bedtime. 07/31/19   Janace Aris, NP  hydrocortisone (ANUSOL-HC) 2.5 % rectal cream Apply rectally 2 times daily 05/15/16   de Villier, Daryl F II, PA  ibuprofen (ADVIL) 600 MG tablet Take 1 tablet (600 mg total) by mouth every 6 (six) hours as needed. 07/31/19   Janace Aris, NP  levonorgestrel (MIRENA) 20 MCG/24HR IUD 1 each by Intrauterine route once.    [provider]  diphenhydrAMINE (BENADRYL) 25 MG tablet Take 1 tablet (25 mg total) by mouth every 6 (six) hours. 06/20/15 07/31/19  Emilia Beck, PA-C    Allergies    Patient has no known allergies.  Review of Systems   Review of Systems  All other systems reviewed and are negative.  Physical Exam Updated Vital Signs Wt 103.6 kg   BMI 43.16 kg/m   Physical Exam Vitals and nursing note reviewed.  30 year old female, resting comfortably and in no acute distress. Vital signs are normal. Oxygen saturation is 100%, which is normal. Head is normocephalic and atraumatic. PERRLA, EOMI. Oropharynx is clear.  There is no facial asymmetry.  Tongue  protrudes in the midline. Neck is nontender and supple without adenopathy or JVD.  There are no carotid bruits. Back is nontender and there is no CVA tenderness. Lungs are clear without rales, wheezes, or rhonchi. Chest is nontender. Heart has regular rate and rhythm without murmur. Abdomen is soft, flat, nontender without masses or hepatosplenomegaly and peristalsis is normoactive. Extremities have no cyanosis or edema, full range of motion is present. Skin is warm and dry without rash. Neurologic: Awake and alert and oriented.  Speech is halting and at times dysarthric and she seems to have some difficulty coming up with certain words.  Comprehension is normal.  Cranial nerves are intact.  There is no pronator drift.  Strength in upper extremities is 5/5.  Strength in the left leg is 5/5.  Strength in the right leg is 4+/5.  There are no sensory deficits and there is no extinction on double simultaneous  stimulation.  ED Results / Procedures / Treatments   Labs (all labs ordered are listed, but only abnormal results are displayed) Labs Reviewed  CBC - Abnormal; Notable for the following components:      Result Value   WBC 14.4 (*)    All other components within normal limits  DIFFERENTIAL - Abnormal; Notable for the following components:   Neutro Abs 8.4 (*)    Lymphs Abs 4.9 (*)    All other components within normal limits  COMPREHENSIVE METABOLIC PANEL - Abnormal; Notable for the following components:   CO2 19 (*)    Glucose, Bld 104 (*)    Total Bilirubin 0.2 (*)    All other components within normal limits  I-STAT CHEM 8, ED - Abnormal; Notable for the following components:   Glucose, Bld 105 (*)    Calcium, Ion 1.09 (*)    TCO2 21 (*)    All other components within normal limits  CBG MONITORING, ED - Abnormal; Notable for the following components:   Glucose-Capillary 102 (*)    All other components within normal limits  RESP PANEL BY RT-PCR (FLU A&B, COVID) ARPGX2  ETHANOL  PROTIME-INR  APTT  RAPID URINE DRUG SCREEN, HOSP PERFORMED  URINALYSIS, ROUTINE W REFLEX MICROSCOPIC  I-STAT BETA HCG BLOOD, ED ([facility], [facility], [facility] ONLY)    EKG EKG Interpretation  Date/Time:  Wednesday February 23 2021 01:25:33 EDT Ventricular Rate:  72 PR Interval:  164 QRS Duration: 83 QT Interval:  386 QTC Calculation: 423 R Axis:   51 Text Interpretation: Sinus rhythm Normal ECG When compared with ECG of 07/21/2017, No significant change was found Confirmed by Dione Booze ([id number]) on 02/23/2021 1:31:38 AM  Radiology MR BRAIN WO CONTRAST  Result Date: 02/23/2021 CLINICAL DATA:  Slurred speech EXAM: MRI HEAD WITHOUT CONTRAST TECHNIQUE: Multiplanar, multiecho pulse sequences of the brain and surrounding structures were obtained without intravenous contrast. COMPARISON:  None. FINDINGS: Brain: No acute infarct, mass effect or extra-axial collection. No acute or chronic hemorrhage. Normal white matter  signal, parenchymal volume and CSF spaces. The midline structures are normal. IMPRESSION: No acute abnormality. Electronically Signed   By: Deatra Robinson M.D.   On: 02/23/2021 02:50   CT HEAD CODE STROKE WO CONTRAST  Result Date: 02/23/2021 CLINICAL DATA:  Code stroke.  Acute neurologic deficit EXAM: CT HEAD WITHOUT CONTRAST TECHNIQUE: Contiguous axial images were obtained from the base of the skull through the vertex without intravenous contrast. COMPARISON:  None. FINDINGS: Brain: There is no mass, hemorrhage or extra-axial collection. The size and configuration of the ventricles  and extra-axial CSF spaces are normal. The brain parenchyma is normal, without evidence of acute or chronic infarction. Vascular: No abnormal hyperdensity of the major intracranial arteries or dural venous sinuses. No intracranial atherosclerosis. Skull: The visualized skull base, calvarium and extracranial soft tissues are normal. Sinuses/Orbits: No fluid levels or advanced mucosal thickening of the visualized paranasal sinuses. No mastoid or middle ear effusion. The orbits are normal. ASPECTS [facility] Stroke Program Early CT Score) - Ganglionic level infarction (caudate, lentiform nuclei, internal capsule, insula, M1-M3 cortex): 7 - Supraganglionic infarction (M4-M6 cortex): 3 Total score (0-10 with 10 being normal): 10 IMPRESSION: 1. Normal head CT 2. ASPECTS is 10 Electronically Signed   By: Deatra Robinson M.D.   On: 02/23/2021 01:19    Procedures Procedures  CRITICAL CARE Performed by: Dione Booze Total critical care time: 45 minutes Critical care time was exclusive of separately billable procedures and treating other patients. Critical care was necessary to treat or prevent imminent or life-threatening deterioration. Critical care was time spent personally by me on the following activities: development of treatment plan with patient and/or surrogate as well as nursing, discussions with consultants, evaluation of  patient's response to treatment, examination of patient, obtaining history from patient or surrogate, ordering and performing treatments and interventions, ordering and review of laboratory studies, ordering and review of radiographic studies, pulse oximetry and re-evaluation of patient's condition.  Medications Ordered in ED Medications  lactated ringers bolus 1,000 mL (1,000 mLs Intravenous New Bag/Given 02/23/21 0204)  prochlorperazine (COMPAZINE) injection 10 mg (10 mg Intravenous Given 02/23/21 0204)  diphenhydrAMINE (BENADRYL) injection 25 mg (25 mg Intravenous Given 02/23/21 0205)   ED Course  I have reviewed the triage vital signs and the nursing notes.  Pertinent labs & imaging results that were available during my care of the patient were reviewed by me and considered in my medical decision making (see chart for details).   MDM Rules/Calculators/A&P                         Headache with speech difficulty and right leg weakness.  Doubt stroke, question migraine variant with neurologic symptoms.  She is sent for emergent CT of head and screening labs are obtained.  Old records were reviewed, and she has no relevant past visits.  CT of head is normal.  Labs are significant only for mild leukocytosis.  On reexam, speech is now normal but she still complains of headache and still has right leg weakness.  We will give a headache cocktail of lactated Ringer solution, prochlorperazine, diphenhydramine and will send for MRI of the brain.  MRI shows no evidence of stroke.  ECG is normal.  Labs are reassuring.  She had excellent relief of her headache with above-noted treatment.  Also, all neurologic symptoms have resolved and she is back to baseline.  This appears to have been a complicated migraine.  She is discharged with outpatient referral to neurology.  Final Clinical Impression(s) / ED Diagnoses Final diagnoses:  Complicated migraine    Rx / DC Orders ED Discharge Orders     None         Dione Booze, MD 02/23/21 0400

## 2021-02-23 NOTE — ED Notes (Signed)
Pt ambulatory with steady gait to bathroom

## 2021-02-23 NOTE — ED Triage Notes (Signed)
Pt arrived with [facility] as a code stroke. Per EMS, son called for patient after noticing slurred speech while pt was getting ready for bed. On EMS arrival, pt had slurred speech, L sided fixed gaze, and bilateral arm weakness. Pt c/o R sided headache, and R hip pain radiating to R lower leg. Pt denies any falls or injuries to hip. LKW 0000

## 2021-02-23 NOTE — ED Notes (Signed)
Pt reports relief of headache. Also denies previous feeling of heaviness in R leg.

## 2021-02-23 NOTE — Consult Note (Signed)
NEURO HOSPITALIST CONSULT NOTE   Requestig physician: Dr. Preston Fleeting  Reason for Consult: Acute onset of throat tightness and speech deficit with RLE weakness  History obtained from:  Patient and EMS  HPI:                                                                                                                                          Pamela Buchanan is an 30 y.o. female who presents to the ED as a Code Stroke with acute onset of throat tightness and speech deficit with RLE weakness. LKN was midnight when the patient went to bed. She awoke shortly afterwards with a sensation of tightness to her anterolateral aspects of her neck and difficulty speaking. Son called EMS.   When EMS arrived the patient had a fixed gaze to the left, which resolved en route. She also had slurred speech after initial complete inability to speak. She could not move her legs at all. She could pick up her arms antigravity but was with symmetric weakness. She apparently was unable to grip EMS personnel's hands when asked. No seizure like activity seen by son or EMS. No incontinence or tongue trauma.   On arrival to the ED, the patient has dysarthric speech with a non-physiological/embellished quality. She endorses a 6/10 non-throbbing headache and right lower extremity weakness.   She states that she has no medical conditions.   Past Medical History:  Diagnosis Date   No pertinent past medical history     Past Surgical History:  Procedure Laterality Date   DILATION AND CURETTAGE OF UTERUS      Family History  Problem Relation Age of Onset   Healthy Mother    Hypertension Father               Social History:  reports that she has never smoked. She has never used smokeless tobacco. She reports current alcohol use. She reports that she does not use drugs.  No Known Allergies  MEDICATIONS:                                                                                                                       No current facility-administered medications on file prior to encounter.   Current Outpatient Medications on File  Prior to Encounter  Medication Sig Dispense Refill   albuterol (PROVENTIL HFA;VENTOLIN HFA) 108 (90 Base) MCG/ACT inhaler Inhale 1-2 puffs into the lungs every 6 (six) hours as needed for wheezing or shortness of breath. 1 Inhaler 0   cyclobenzaprine (FLEXERIL) 10 MG tablet Take 0.5 tablets (5 mg total) by mouth at bedtime. 20 tablet 0   hydrocortisone (ANUSOL-HC) 2.5 % rectal cream Apply rectally 2 times daily 29 g 0   ibuprofen (ADVIL) 600 MG tablet Take 1 tablet (600 mg total) by mouth every 6 (six) hours as needed. 30 tablet 0   levonorgestrel (MIRENA) 20 MCG/24HR IUD 1 each by Intrauterine route once.     [DISCONTINUED] diphenhydrAMINE (BENADRYL) 25 MG tablet Take 1 tablet (25 mg total) by mouth every 6 (six) hours. 20 tablet 0     ROS:                                                                                                                                       No CP or SOB. Has right lower extremity pain from knee down, as well as right buttock pain and right thigh tightness sensation. No vision changes. Does not perceive any facial weakness. Other ROS as per HPI.    unknown if currently breastfeeding.   General Examination:                                                                                                       Physical Exam  HEENT-  [place]/AT. No nuchal rigidity.   Lungs- Respirations unlabored Extremities- No edema  Neurological Examination Mental Status: Awake and alert. Fully oriented. Speech is slow with stuttering/halting quality and dysarthria which has an odd/nonphysiological quality most consistent with volitional embellishment or conversion disorder. Naming and repetition intact. No errors of grammar or syntax. Able to follow all commands. The odd-quality dysarthria resolves with distraction (when describing her RLE pain  complaints).  Cranial Nerves: II: Visual fields intact. No extinction to DSS. PERRL.  III,IV, VI: No ptosis. EOMI. No nystagmus.  V,VII: Smile symmetric, facial temp sensation equal bilaterally VIII: hearing intact to voice IX,X: Functional hypophonia XI: Head is midline XII: Midline tongue extension Motor: RUE 5/5 with giveway LUE 5/5 with giveway.  No pronator drift or asymmetry of strength in BUE. There is hesitancy of movement which has a functional quality.  RLE with apparent pain on movement in conjunction with strained affect, but she moves and fidgets with it spontaneously and normally during  CT scanning protocol. Maximum elicitable strength in the context of giveway is 4+/5 LLE 5/5 Sensory: Temp and light touch intact throughout, bilaterally without asymmetry, except for subjectively symmetrically decreased temp sensation to feet bilaterally  Deep Tendon Reflexes: 2+ and symmetric throughout Plantars: Right: downgoing   Left: downgoing Cerebellar: No ataxia with FNF bilaterally. Slow, laborious movements with H-S bilaterally in conjunction with strained/pained affect, more prominently on the right, without ataxia.  Gait: Deferred   Lab Results: Basic Metabolic Panel: No results for input(s): NA, K, CL, CO2, GLUCOSE, BUN, CREATININE, CALCIUM, MG, PHOS in the last 168 hours.  CBC: No results for input(s): WBC, NEUTROABS, HGB, HCT, MCV, PLT in the last 168 hours.  Cardiac Enzymes: No results for input(s): CKTOTAL, CKMB, CKMBINDEX, TROPONINI in the last 168 hours.  Lipid Panel: No results for input(s): CHOL, TRIG, HDL, CHOLHDL, VLDL, LDLCALC in the last 168 hours.  Imaging: No results found.   Assessment: 30 year old female presenting with acute onset of throat tightness and speech deficit with RLE weakness 1. Exam reveals unusual pattern of dysarthria and stuttering speech, both of which are distractable and with a quality that is most consistent with volitional  embellishment versus conversion disorder. Also with giveway weakness. No ataxia, facial droop or ocular motility deficit noted.  2. CT head negative for acute abnormality 3. WBC elevated at 14.4  Recommendations: 1. STAT MRI to rule out stroke.  2. Frequent neuro checks.  3. Further recommendations pending MRI.  4. Work up of leukocytosis.   Electronically signed: Dr. Caryl Pina 02/23/2021, 1:01 AM

## 2021-04-12 ENCOUNTER — Encounter: Payer: Self-pay | Admitting: Neurology

## 2021-04-12 ENCOUNTER — Ambulatory Visit (INDEPENDENT_AMBULATORY_CARE_PROVIDER_SITE_OTHER): Payer: 59 | Admitting: Neurology

## 2021-04-12 VITALS — BP 111/74 | HR 69 | Ht 61.0 in | Wt 228.0 lb

## 2021-04-12 DIAGNOSIS — F439 Reaction to severe stress, unspecified: Secondary | ICD-10-CM

## 2021-04-12 DIAGNOSIS — G44229 Chronic tension-type headache, not intractable: Secondary | ICD-10-CM | POA: Diagnosis not present

## 2021-04-12 MED ORDER — TIZANIDINE HCL 2 MG PO CAPS: 2.0000 mg | ORAL_CAPSULE | ORAL | 0 refills | Status: DC | PRN

## 2021-04-12 NOTE — Progress Notes (Signed)
[facility]  PATIENT: Pamela Buchanan DOB: Jun 22, 1991  REFERRING CLINICIAN: Dione Booze, MD HISTORY FROM: Patient  REASON FOR VISIT: Headaches    HISTORICAL  CHIEF COMPLAINT:  Chief Complaint  Patient presents with   Migraine    New patient: Internal Referral: Had stroke like symptoms and diagnosed with migraines at the ED Room 13, alone in room    HISTORY OF PRESENT ILLNESS:  This is a 30 year old woman with no past medical history who is presenting for headaches.  Patient states that on July 13, she woke up out of sleep with severe muscles spasm around her neck, she could not talk, could not get out of bed, and could not say her son's name.  She did have asked him to call 911 as she thought she was having a stroke.  Patient was taken to the emergency department where she complained of headache, had some difficulty with speech, and was noted to have right-sided weakness.  She had a head CT which was negative for any acute bleeding and had a MRI of her brain which was negative for acute stroke.  She was treated with migraine cocktail and which seems to improve her symptoms and she was discharged home.  Patient stated that since July she never had episodes similar to that one, and prior to that she never had a episode similar.  However she reported having about 2-3 days per week of headache that she describes as mild aching pain around both temples, usually Tylenol will take the pain away.  Sometimes the pain can linger and not resolved with Tylenol but the pain is not associated with nausea no vomiting, no photophobia no phonophobia.  She denies any family history of migraines. Had never taken a preventive or abortive prescription medications.   Headache History and Characteristics: Onset: Throughout her adult life  Location: Bilateral temples Quality: Aching  Intensity: 3-4/10.  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  Prior prophylaxis: Propranolol: No  Verapamil:No TCA: No Topamax: No Depakote: No Effexor: No Cymbalta: No Neurontin:No  Prior abortives: Triptan: No Anti-emetic: No Steroids: No Ergotamine suppository: No  Prior interventions: None   OTHER MEDICAL CONDITIONS: None, only takes multivitamin    REVIEW OF SYSTEMS: Full 14 system review of systems performed and negative with exception of: as noted in the HPI  ALLERGIES: No Known Allergies  HOME MEDICATIONS: Outpatient Medications Prior to Visit  Medication Sig Dispense Refill   Multiple Vitamin (MULTIVITAMIN) tablet Take 1 tablet by mouth daily.     albuterol (PROVENTIL HFA;VENTOLIN HFA) 108 (90 Base) MCG/ACT inhaler Inhale 1-2 puffs into the lungs every 6 (six) hours as needed for wheezing or shortness of breath. 1 Inhaler 0   cyclobenzaprine (FLEXERIL) 10 MG tablet Take 0.5 tablets (5 mg total) by mouth at bedtime. 20 tablet 0   hydrocortisone (ANUSOL-HC) 2.5 % rectal cream Apply rectally 2 times daily 29 g 0   ibuprofen (ADVIL) 600 MG tablet Take 1 tablet (600 mg total) by mouth every 6 (six) hours as needed. 30 tablet 0   levonorgestrel (MIRENA) 20 MCG/24HR IUD 1 each by Intrauterine route once.     No facility-administered medications prior to visit.  PAST MEDICAL HISTORY: Past Medical History:  Diagnosis Date   No pertinent past medical history     PAST SURGICAL HISTORY: Past Surgical History:  Procedure Laterality Date   DILATION AND CURETTAGE OF UTERUS      FAMILY HISTORY: Family History  Problem Relation Age of Onset   Healthy Mother    Hypertension Father     SOCIAL HISTORY: Social History   Socioeconomic History   Marital status: Single    Spouse name: Not on file   Number of  children: Not on file   Years of education: Not on file   Highest education level: Not on file  Occupational History   Not on file  Tobacco Use   Smoking status: Never   Smokeless tobacco: Never  Vaping Use   Vaping Use: Never used  Substance and Sexual Activity   Alcohol use: Yes    Comment: occasional socially   Drug use: No   Sexual activity: Yes  Other Topics Concern   Not on file  Social History Narrative   Lives with son   Right Handed   Drinks caffeine rarely   Social Determinants of Health   Financial Resource Strain: Not on file  Food Insecurity: Not on file  Transportation Needs: Not on file  Physical Activity: Not on file  Stress: Not on file  Social Connections: Not on file  Intimate Partner Violence: Not on file     PHYSICAL EXAM   GENERAL EXAM/CONSTITUTIONAL: Vitals:  Vitals:   04/12/21 0855  BP: 111/74  Pulse: 69  Weight: 228 lb (103.4 kg)  Height: 5\' 1"  (1.549 m)   Body mass index is 43.08 kg/m. Wt Readings from Last 3 Encounters:  04/12/21 228 lb (103.4 kg)  02/23/21 228 lb 6.3 oz (103.6 kg)  07/21/17 184 lb (83.5 kg)   Patient is in no distress; well developed, nourished and groomed; neck is supple  CARDIOVASCULAR: Examination of carotid arteries is normal; no carotid bruits Regular rate and rhythm, no murmurs Examination of peripheral vascular system by observation and palpation is normal  EYES: Pupils round and reactive to light, Visual fields full to confrontation, Extraocular movements intacts,   MUSCULOSKELETAL: Gait, strength, tone, movements noted in Neurologic exam below  NEUROLOGIC: MENTAL STATUS:  awake, alert, oriented to person, place and time recent and remote memory intact normal attention and concentration language fluent, comprehension intact, naming intact fund of knowledge appropriate  CRANIAL NERVE:  2nd - no papilledema or hemorrhages on fundoscopic exam 2nd, 3rd, 4th, 6th - pupils equal and reactive to  light, visual fields full to confrontation, extraocular muscles intact, no nystagmus 5th - facial sensation symmetric 7th - facial strength symmetric 8th - hearing intact 9th - palate elevates symmetrically, uvula midline 11th - shoulder shrug symmetric 12th - tongue protrusion midline  MOTOR:  normal bulk and tone, full strength in the BUE, BLE  SENSORY:  normal and symmetric to light touch, pinprick, temperature, vibration  COORDINATION:  finger-nose-finger, fine finger movements normal  REFLEXES:  deep tendon reflexes present and symmetric  GAIT/STATION:  normal   DIAGNOSTIC DATA (LABS, IMAGING, TESTING) - I reviewed patient records, labs, notes, testing and imaging myself where available.  Lab Results  Component Value Date   WBC 14.4 (H) 02/23/2021   HGB 13.9 02/23/2021   HCT 41.0 02/23/2021   MCV 91.8 02/23/2021   PLT 265 02/23/2021      Component Value Date/Time   NA 137 02/23/2021 0107   K 3.7 02/23/2021  0107   CL 106 02/23/2021 0107   CO2 19 (L) 02/23/2021 0057   GLUCOSE 105 (H) 02/23/2021 0107   BUN 12 02/23/2021 0107   CREATININE 0.70 02/23/2021 0107   CALCIUM 9.5 02/23/2021 0057   PROT 7.4 02/23/2021 0057   ALBUMIN 3.9 02/23/2021 0057   AST 25 02/23/2021 0057   ALT 15 02/23/2021 0057   ALKPHOS 65 02/23/2021 0057   BILITOT 0.2 (L) 02/23/2021 0057   GFRNONAA >60 02/23/2021 0057   GFRAA >60 07/21/2017 1756   No results found for: CHOL, HDL, LDLCALC, LDLDIRECT, TRIG, CHOLHDL No results found for: MIWO0H No results found for: VITAMINB12 No results found for: TSH  Head CT 02/23/2021: Normal head CT  Brain MRI 02/23/2021: No acute infarct, mass effect or extra-axial collection. No acute or chronic hemorrhage. Normal white matter signal, parenchymal volume and CSF spaces. The midline structures are normal.   ASSESSMENT AND PLAN  30 y.o. year old female with no past medical history who is presenting after a acute episode of headache associated with  difficulty speaking and right-sided weakness in July.  At that time she was evaluated for stroke and was negative, she was treated with a migraine cocktail which seemed to relieve her pain.  Never had an episode like that in in her life.  Currently she is having 2 to 3 days of tension type headache, 4/10 described as aching bilateral temples, no migrainous features.  Patient also reports lots of stress from home, she is a single mom and life can be difficult. She also reports work-related stress, and acknowledged the fact that she has difficulty managing her stress.  For tension type headache I will recommend over-the-counter magnesium, riboflavin and co-Q10 to take nightly, I will also prescribe her Zanaflex to use as needed.  For her increased stress and will refer to psychology and hopefully patient can be set up with a therapist that can manage her stress.  I will see her in 6 months for follow-up.  Advised patient to contact me via MyChart for any question or concern.   1. Chronic tension-type headache, not intractable   2. Stress     PLAN: Take Magnesium 400 mg nightly  Riboflavin 400 mg nightly  CoQ10 100 mg nightly  Can also take Tizanidine 2 mg as needed for stronger headaches  Referral to psychology/therapist  Return to clinic in 6 months    Orders Placed This Encounter  Procedures   Ambulatory referral to Psychology    Meds ordered this encounter  Medications   tizanidine (ZANAFLEX) 2 MG capsule    Sig: Take 1 capsule (2 mg total) by mouth as needed for up to 30 doses for muscle spasms (Headaches).    Dispense:  30 capsule    Refill:  0    Return in about 6 months (around 10/11/2021).    Windell Norfolk, MD 04/12/2021, 9:41 AM  [facility] [address] [place], [place] [address] [phone]

## 2021-04-12 NOTE — Patient Instructions (Signed)
Take Magnesium 400 mg nightly  Riboflavin 400 mg nightly  CoQ10 100 mg nightly  Can also take Tizanidine 2 mg as needed for stronger headaches  Referral to psychology/therapist  Return to clinic in 6 months

## 2021-05-03 ENCOUNTER — Ambulatory Visit: Payer: 59 | Admitting: Psychology

## 2021-06-06 ENCOUNTER — Other Ambulatory Visit: Payer: Self-pay

## 2021-06-06 ENCOUNTER — Emergency Department (HOSPITAL_COMMUNITY)
Admission: EM | Admit: 2021-06-06 | Discharge: 2021-06-06 | Disposition: A | Discharge status: Home / Self Care | Payer: Medicaid Other | Attending: Physician Assistant | Admitting: Physician Assistant

## 2021-06-06 ENCOUNTER — Emergency Department (HOSPITAL_COMMUNITY): Payer: Medicaid Other

## 2021-06-06 ENCOUNTER — Encounter (HOSPITAL_COMMUNITY): Payer: Self-pay | Admitting: Emergency Medicine

## 2021-06-06 DIAGNOSIS — R079 Chest pain, unspecified: Secondary | ICD-10-CM | POA: Diagnosis not present

## 2021-06-06 DIAGNOSIS — Z5321 Procedure and treatment not carried out due to patient leaving prior to being seen by health care provider: Secondary | ICD-10-CM | POA: Insufficient documentation

## 2021-06-06 DIAGNOSIS — R0789 Other chest pain: Secondary | ICD-10-CM | POA: Insufficient documentation

## 2021-06-06 LAB — CBC
HCT: 38.4 % (ref 36.0–46.0)
Hemoglobin: 12.3 g/dL (ref 12.0–15.0)
MCH: 29.4 pg (ref 26.0–34.0)
MCHC: 32 g/dL (ref 30.0–36.0)
MCV: 91.6 fL (ref 80.0–100.0)
Platelets: 298 10*3/uL (ref 150–400)
RBC: 4.19 MIL/uL (ref 3.87–5.11)
RDW: 12.9 % (ref 11.5–15.5)
WBC: 10.8 10*3/uL — ABNORMAL HIGH (ref 4.0–10.5)
nRBC: 0 % (ref 0.0–0.2)

## 2021-06-06 LAB — BASIC METABOLIC PANEL
Anion gap: 11 (ref 5–15)
BUN: 9 mg/dL (ref 6–20)
CO2: 21 mmol/L — ABNORMAL LOW (ref 22–32)
Calcium: 8.9 mg/dL (ref 8.9–10.3)
Chloride: 104 mmol/L (ref 98–111)
Creatinine, Ser: 0.72 mg/dL (ref 0.44–1.00)
GFR, Estimated: >60 mL/min (ref >60)
Glucose, Bld: 107 mg/dL — ABNORMAL HIGH (ref 70–99)
Potassium: 4 mmol/L (ref 3.5–5.1)
Sodium: 136 mmol/L (ref 135–145)

## 2021-06-06 LAB — TROPONIN I (HIGH SENSITIVITY): Troponin I (High Sensitivity): 3 ng/L (ref <18)

## 2021-06-06 LAB — D-DIMER, QUANTITATIVE: D-Dimer, Quant: 0.5 ug/mL-FEU (ref 0.00–0.50)

## 2021-06-06 LAB — I-STAT BETA HCG BLOOD, ED (MC, WL, AP ONLY): I-stat hCG, quantitative: <5 m[IU]/mL (ref <5)

## 2021-06-06 NOTE — ED Notes (Signed)
Pt back from x-ray.

## 2021-06-06 NOTE — ED Triage Notes (Signed)
Pt reports sudden onset of left sided chest pain that goes to her left arm.  Denies any SOB but states it does hurt to breath.

## 2021-06-06 NOTE — ED Notes (Signed)
Patient transported to X-ray 

## 2021-06-06 NOTE — ED Notes (Signed)
Attempted lab draw x1. Rn notified unable to get labs

## 2021-06-06 NOTE — ED Provider Notes (Signed)
Emergency Medicine Provider Triage Evaluation Note  Pamela Buchanan , a 30 y.o. female  was evaluated in triage.  Pt complains of left-sided chest pain onset around 3 AM.  Patient reports that it radiates up into her left shoulder that is worse when she takes a deep breath.  No history of same.  Had a recent flight to [facility] x2 last week and some driving but no swelling of her lower extremities or pain.  No history of DVT or PE.  Patient denies nausea, vomiting, shortness of breath.  Review of Systems  Positive: Chest pain Negative: Shortness of breath, nausea, vomiting  Physical Exam  BP 112/76 (BP Location: Right Arm)   Pulse 75   Temp 98.4 F (36.9 C) (Oral)   Resp 16   SpO2 95%   Gen:   Awake, no distress   Resp:  Normal effort -clear and equal breath sounds MSK:   Moves extremities without difficulty, no calf tenderness Other:  Regular rate and rhythm  Medical Decision Making  Medically screening exam initiated at 5:11 AM.  Appropriate orders placed.  Pamela Buchanan was informed that the remainder of the evaluation will be completed by another provider, this initial triage assessment does not replace that evaluation, and the importance of remaining in the ED until their evaluation is complete.  Chest pain.  Work-up pending.   Pamela Buchanan, Pamela Buchanan 06/06/21 2707    Pamela Lyons, MD 06/07/21 0500

## 2021-06-26 ENCOUNTER — Encounter (HOSPITAL_COMMUNITY): Payer: Self-pay | Admitting: Emergency Medicine

## 2021-06-26 ENCOUNTER — Ambulatory Visit (HOSPITAL_COMMUNITY)
Admission: EM | Admit: 2021-06-26 | Discharge: 2021-06-26 | Disposition: A | Discharge status: Home / Self Care | Payer: Medicaid Other | Attending: Emergency Medicine | Admitting: Emergency Medicine

## 2021-06-26 ENCOUNTER — Other Ambulatory Visit: Payer: Self-pay

## 2021-06-26 DIAGNOSIS — B86 Scabies: Secondary | ICD-10-CM | POA: Diagnosis not present

## 2021-06-26 MED ORDER — PERMETHRIN 5 % EX CREA: TOPICAL_CREAM | Freq: Once | CUTANEOUS | 0 refills | Status: AC

## 2021-06-26 NOTE — ED Provider Notes (Signed)
[facility]    CSN: [id number] Arrival date & time: 06/26/21  1017      History   Chief Complaint Chief Complaint  Patient presents with   Rash    HPI Pamela Buchanan is a 30 y.o. female.   30 year old female patient, Pamela Buchanan, presents to urgent care chief complaint of rash for a week.  Patient states she has been traveling back and forth to [place] for job and stayed at a hotel prior to rash development.  Patient states the rash is worse at nighttime, has tried hydrocortisone cream without relief.  The history is provided by the patient. No language interpreter was used.   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 Authorizing Provider  permethrin (ELIMITE) 5 % cream Apply topically once for 1 dose. Apply to entire body, wash off after 9-12 hours, then repeat in 1 week. 06/26/21 06/26/21 Yes Shandee Jergens, Para March, NP  Multiple Vitamin (MULTIVITAMIN) tablet Take 1 tablet by mouth daily.    [provider]  tizanidine (ZANAFLEX) 2 MG capsule Take 1 capsule (2 mg total) by mouth as needed for up to 30 doses for muscle spasms (Headaches). 04/12/21   Windell Norfolk, MD  diphenhydrAMINE (BENADRYL) 25 MG tablet Take 1 tablet (25 mg total) by mouth every 6 (six) hours. 06/20/15 07/31/19  Emilia Beck, PA-C    Family History Family History  Problem Relation Age of Onset   Healthy Mother    Hypertension Father      Social History Social History   Tobacco Use   Smoking status: Never   Smokeless tobacco: Never  Vaping Use   Vaping Use: Never used  Substance Use Topics   Alcohol use: Yes    Comment: occasional socially   Drug use: No     Allergies   Patient has no known allergies.   Review of Systems Review of Systems  Constitutional:  Negative for fever.  Skin:  Positive for rash.  All other systems reviewed and are negative.   Physical Exam Triage Vital Signs ED Triage Vitals  Enc Vitals Group     BP 06/26/21 1129 112/74     Pulse Rate 06/26/21 1129 71     Resp 06/26/21 1129 17     Temp 06/26/21 1129 98.1 F (36.7 C)     Temp Source 06/26/21 1129 Oral     SpO2 06/26/21 1129 97 %     Weight --      Height --      Head Circumference --      Peak Flow --      Pain Score 06/26/21 1127 0  Pain Loc --      Pain Edu? --      Excl. in GC? --    No data found.  Updated Vital Signs BP 112/74 (BP Location: Right Arm)   Pulse 71   Temp 98.1 F (36.7 C) (Oral)   Resp 17   LMP 05/29/2021   SpO2 97%   Visual Acuity Right Eye Distance:   Left Eye Distance:   Bilateral Distance:    Right Eye Near:   Left Eye Near:    Bilateral Near:     Physical Exam Skin:    General: Skin is warm.     Capillary Refill: Capillary refill takes less than 2 seconds.     Findings: Rash present. Rash is macular and papular.     Comments: Linear burrow line red rash noted between fingers, folds of skin, trunk,upper arms     UC Treatments / Results  Labs (all labs ordered are listed, but only abnormal results are displayed) Labs Reviewed - No data to display  EKG   Radiology No results found.  Procedures Procedures (including critical care time)  Medications Ordered in UC Medications - No data to display  Initial Impression / Assessment and Plan / UC Course  I have reviewed the triage vital signs and the nursing notes.  Pertinent labs & imaging results that were  available during my care of the patient were reviewed by me and considered in my medical decision making (see chart for details).    Recommend avoid heat,hot water as it makes rashes worse. Try OTC benadryl at night, zyrtec during day, use permetherin cream as directed. Pt verbalized understanding to this provider.   Ddx: Scabies, dermatitis. Final Clinical Impressions(s) / UC Diagnoses   Final diagnoses:  Scabies     Discharge Instructions      Use medicine as directed. Scabies cleaning as directed. You are contagious until meds and home treated. Follow up with PCP.      ED Prescriptions     Medication Sig Dispense Auth. Provider   permethrin (ELIMITE) 5 % cream Apply topically once for 1 dose. Apply to entire body, wash off after 9-12 hours, then repeat in 1 week. 60 g Sherylann Vangorden, NP      PDMP not reviewed this encounter.   Clancy Gourd, NP 06/26/21 1215

## 2021-06-26 NOTE — ED Triage Notes (Signed)
Pt presents with rash from waist up and bilateral arm xs 7 days.

## 2021-06-26 NOTE — Discharge Instructions (Signed)
Use medicine as directed. Scabies cleaning as directed. You are contagious until meds and home treated. Follow up with PCP.

## 2021-08-02 ENCOUNTER — Other Ambulatory Visit: Payer: Self-pay | Admitting: Physician Assistant

## 2021-08-02 DIAGNOSIS — R198 Other specified symptoms and signs involving the digestive system and abdomen: Secondary | ICD-10-CM

## 2021-08-14 HISTORY — PX: COLONOSCOPY: SHX174

## 2021-08-26 ENCOUNTER — Ambulatory Visit
Admission: RE | Admit: 2021-08-26 | Discharge: 2021-08-26 | Disposition: A | Discharge status: Home / Self Care | Payer: Managed Care, Other (non HMO) | Source: Ambulatory Visit | Attending: Physician Assistant | Admitting: Physician Assistant

## 2021-08-26 DIAGNOSIS — R198 Other specified symptoms and signs involving the digestive system and abdomen: Secondary | ICD-10-CM

## 2021-08-26 DIAGNOSIS — R188 Other ascites: Secondary | ICD-10-CM | POA: Diagnosis not present

## 2021-08-26 DIAGNOSIS — K6289 Other specified diseases of anus and rectum: Secondary | ICD-10-CM | POA: Diagnosis not present

## 2021-08-26 MED ORDER — IOPAMIDOL (ISOVUE-300) INJECTION 61%
100.0000 mL | Freq: Once | INTRAVENOUS | Status: AC | PRN
  Administered 2021-08-26: 100 mL via INTRAVENOUS

## 2021-10-11 ENCOUNTER — Ambulatory Visit: Payer: 59 | Admitting: Neurology

## 2021-10-27 ENCOUNTER — Ambulatory Visit: Payer: Managed Care, Other (non HMO) | Admitting: Internal Medicine

## 2021-11-10 DIAGNOSIS — R051 Acute cough: Secondary | ICD-10-CM | POA: Diagnosis not present

## 2021-11-10 DIAGNOSIS — J069 Acute upper respiratory infection, unspecified: Secondary | ICD-10-CM | POA: Diagnosis not present

## 2021-11-10 DIAGNOSIS — Z20822 Contact with and (suspected) exposure to covid-19: Secondary | ICD-10-CM | POA: Diagnosis not present

## 2021-11-11 ENCOUNTER — Other Ambulatory Visit: Payer: Self-pay

## 2021-11-11 ENCOUNTER — Emergency Department (HOSPITAL_COMMUNITY)
Admission: EM | Admit: 2021-11-11 | Discharge: 2021-11-12 | Disposition: A | Discharge status: Home / Self Care | Payer: Medicaid Other | Attending: Emergency Medicine | Admitting: Emergency Medicine

## 2021-11-11 ENCOUNTER — Encounter (HOSPITAL_COMMUNITY): Payer: Self-pay | Admitting: Emergency Medicine

## 2021-11-11 DIAGNOSIS — R21 Rash and other nonspecific skin eruption: Secondary | ICD-10-CM | POA: Insufficient documentation

## 2021-11-11 NOTE — ED Triage Notes (Signed)
Pt c/o allergic reaction to an insect bite. Pt has multiple areas that are red and swollen on left arm, back, and left leg. Pt states that she was dx with URI x 2 days ago, but her breathing feels different.  ?

## 2021-11-12 DIAGNOSIS — H109 Unspecified conjunctivitis: Secondary | ICD-10-CM | POA: Diagnosis not present

## 2021-11-12 DIAGNOSIS — H1013 Acute atopic conjunctivitis, bilateral: Secondary | ICD-10-CM | POA: Diagnosis not present

## 2021-11-12 DIAGNOSIS — J309 Allergic rhinitis, unspecified: Secondary | ICD-10-CM | POA: Diagnosis not present

## 2021-11-12 NOTE — Discharge Instructions (Addendum)
Like we discussed, you can continue to take Benadryl as needed for management of your rash and itchiness.  Please keep in mind that this can be sedating so do not drive a car after taking it.  Do not mix with alcohol.  I would also recommend that you fill the previously prescribed medications and begin taking them as well.  They will likely help with your symptoms as well as your rash. ? ?Please continue to monitor your symptoms closely.  If you develop any new or worsening symptoms please come back to the emergency department for reevaluation.  Please also follow-up with your regular doctor regarding your symptoms as well as this visit today. ?

## 2021-11-12 NOTE — ED Provider Notes (Signed)
?[facility] EMERGENCY DEPARTMENT ?Provider Note ? ? ?CSN: [id number] ?Arrival date & time: 11/11/21  2305 ? ?  ? ?History ? ?Chief Complaint  ?Patient presents with  ? Insect Bite  ? ? ?Pamela Buchanan is a 31 y.o. female. ? ?HPI ?Patient is a 31 year old female who presents to the emergency department due to a rash.  Patient initially seen in urgent care 2 days ago for cough, headache, nasal congestion, rhinorrhea, shortness of breath.  She had a rapid influenza A, influenza B, and COVID test which were all negative.  She was discharged on albuterol, Tessalon Perles, as well as prednisone but states that she has not picked up these medications.  She has still continued to experience her URI symptoms.  Yesterday she then began developing new round itchy red rash to the left upper arm, lower legs, as well as the right side of her back.  Denies any pain or drainage in the regions.  No new clothing, lotions, detergents, perfumes.  Denies anyone in her household with similar symptoms. ?  ? ?Home Medications ?Prior to Admission medications   ?Medication Sig Start Date End Date Taking? Authorizing Provider  ?Multiple Vitamin (MULTIVITAMIN) tablet Take 1 tablet by mouth daily.    [provider]  ?tizanidine (ZANAFLEX) 2 MG capsule Take 1 capsule (2 mg total) by mouth as needed for up to 30 doses for muscle spasms (Headaches). 04/12/21   Windell Norfolk, MD  ?diphenhydrAMINE (BENADRYL) 25 MG tablet Take 1 tablet (25 mg total) by mouth every 6 (six) hours. 06/20/15 07/31/19  Emilia Beck, PA-C  ?   ? ?Allergies    ?Patient has no known allergies.   ? ?Review of Systems   ?Review of Systems  ?Constitutional:  Negative for chills and fever.  ?HENT:  Positive for congestion and rhinorrhea.   ?Respiratory:  Positive for cough.   ?Cardiovascular:  Negative for chest pain.  ?Gastrointestinal:  Negative for abdominal pain and vomiting.  ?Skin:  Positive for color change and rash.  ? ?Physical  Exam ?Updated Vital Signs ?BP 128/81 (BP Location: Right Arm)   Pulse 97   Temp 98.6 ?F (37 ?C)   Resp 20   Ht 5\' 1"  (1.549 m)   Wt 99.8 kg   SpO2 97%   BMI 41.57 kg/m?  ?Physical Exam ?Vitals and nursing note reviewed.  ?Constitutional:   ?   General: She is not in acute distress. ?   Appearance: Normal appearance. She is well-developed. She is not ill-appearing, toxic-appearing or diaphoretic.  ?HENT:  ?   Head: Normocephalic and atraumatic.  ?   Right Ear: External ear normal.  ?   Left Ear: External ear normal.  ?   Nose: Nose normal.  ?   Mouth/Throat:  ?   Mouth: Mucous membranes are moist.  ?   Pharynx: Oropharynx is clear. No oropharyngeal exudate or posterior oropharyngeal erythema.  ?Eyes:  ?   General: No scleral icterus.    ?   Right eye: No discharge.     ?   Left eye: No discharge.  ?   Extraocular Movements: Extraocular movements intact.  ?   Conjunctiva/sclera: Conjunctivae normal.  ?Neck:  ?   Trachea: No tracheal deviation.  ?Cardiovascular:  ?   Rate and Rhythm: Normal rate and regular rhythm.  ?   Pulses: Normal pulses.  ?   Heart sounds: Normal heart sounds. No murmur heard. ?  No friction rub. No gallop.  ?Pulmonary:  ?  Effort: Pulmonary effort is normal. No respiratory distress.  ?   Breath sounds: Normal breath sounds. No stridor. No wheezing, rhonchi or rales.  ?Abdominal:  ?   General: Abdomen is flat. There is no distension.  ?   Palpations: Abdomen is soft.  ?   Tenderness: There is no abdominal tenderness.  ?Musculoskeletal:     ?   General: No swelling or deformity. Normal range of motion.  ?   Cervical back: Normal range of motion and neck supple. No tenderness.  ?Skin: ?   General: Skin is warm and dry.  ?   Findings: Erythema present. No rash.  ?   Comments: Erythematous round macular lesions noted to the left inner arm, left lower leg, as well as the right low back.  Nontender.  No drainage noted.    ?Neurological:  ?   General: No focal deficit present.  ?   Mental Status:  She is alert and oriented to person, place, and time.  ?   Cranial Nerves: Cranial nerve deficit: no gross deficits.  ?Psychiatric:     ?   Mood and Affect: Mood normal.     ?   Behavior: Behavior normal.  ? ?ED Results / Procedures / Treatments   ?Labs ?(all labs ordered are listed, but only abnormal results are displayed) ?Labs Reviewed - No data to display ? ?EKG ?None ? ?Radiology ?No results found. ? ?Procedures ?Procedures  ? ?Medications Ordered in ED ?Medications - No data to display ? ?ED Course/ Medical Decision Making/ A&P ?  ?                        ?Medical Decision Making ?Patient is a 31 year old female who presents to the emergency department due to a rash.  Initially began experiencing URI symptoms a few days ago and went to urgent care.  She was prescribed albuterol, Tessalon Perles, as well as prednisone but has not yet filled these medications.  She then began developing a pruritic erythematous rash to the left arm, lower legs, as well as the right lower back.  States that they initially appeared like small nodules that she thought were bug bites and she was applying hydrocortisone and taking Benadryl with little relief but they grew in size and became more pruritic so she came to the emergency department for evaluation. ? ?On my exam patient has round macular lesions to the left upper arm, left lower leg, as well as the right lower back.  No drainage and they are nontender.  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  Given the diffuse presentation of the lesions and that they are nontender, doubt erythema nodosum.  She was tested for flu as well as COVID at urgent care and they were both negative.  Do not feel that repeat testing is warranted.  Patient currently afebrile, nontachycardic, and nontoxic-appearing. ? ?Recommended that patient fill and begin taking her prednisone.  Denies a history of diabetes mellitus.  Recommended that she also continue with Benadryl as needed.   We discussed return precautions.  Recommended PCP follow-up.  Her questions were answered and she was amicable at the time of discharge. ?Final Clinical Impression(s) / ED Diagnoses ?Final diagnoses:  ?Rash  ? ?Rx / DC Orders ?ED Discharge Orders   ? ? None  ? ?  ? ? ?  ?Placido Sou, PA-C ?11/12/21 0157 ? ?  ?Sabas Sous, MD ?11/12/21 [phone] ? ?

## 2021-12-01 ENCOUNTER — Ambulatory Visit: Payer: Managed Care, Other (non HMO) | Admitting: Internal Medicine

## 2021-12-19 ENCOUNTER — Ambulatory Visit: Payer: Medicaid Other | Admitting: Internal Medicine

## 2021-12-19 ENCOUNTER — Other Ambulatory Visit (HOSPITAL_COMMUNITY)
Admission: RE | Admit: 2021-12-19 | Discharge: 2021-12-19 | Disposition: A | Discharge status: Home / Self Care | Payer: Medicaid Other | Source: Ambulatory Visit | Attending: Internal Medicine | Admitting: Internal Medicine

## 2021-12-19 ENCOUNTER — Encounter: Payer: Self-pay | Admitting: Internal Medicine

## 2021-12-19 VITALS — BP 117/66 | HR 77

## 2021-12-19 DIAGNOSIS — F411 Generalized anxiety disorder: Secondary | ICD-10-CM

## 2021-12-19 DIAGNOSIS — R198 Other specified symptoms and signs involving the digestive system and abdomen: Secondary | ICD-10-CM

## 2021-12-19 DIAGNOSIS — Z7251 High risk heterosexual behavior: Secondary | ICD-10-CM | POA: Insufficient documentation

## 2021-12-19 MED ORDER — DULOXETINE HCL 30 MG PO CPEP: 30.0000 mg | ORAL_CAPSULE | Freq: Every day | ORAL | 0 refills | Status: DC
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
## 2021-12-20 ENCOUNTER — Encounter: Payer: Self-pay | Admitting: Internal Medicine

## 2021-12-20 DIAGNOSIS — R198 Other specified symptoms and signs involving the digestive system and abdomen: Secondary | ICD-10-CM | POA: Insufficient documentation

## 2021-12-20 DIAGNOSIS — F411 Generalized anxiety disorder: Secondary | ICD-10-CM | POA: Insufficient documentation

## 2021-12-20 LAB — URINE CYTOLOGY ANCILLARY ONLY
Chlamydia: NEGATIVE
Comment: NEGATIVE
Comment: NORMAL
Neisseria Gonorrhea: NEGATIVE

## 2021-12-20 LAB — HIV ANTIBODY (ROUTINE TESTING W REFLEX): HIV Screen 4th Generation wRfx: NONREACTIVE

## 2021-12-20 NOTE — Assessment & Plan Note (Signed)
This has been an ongoing issue for her for at least the past year.  Her prior PCP referred her to GI, at which time she underwent a colonoscopy which was unrevealing. ?She notes that the anal discharge is constant, and can saturate a sanitary pad throughout the day.  Symptoms are fairly impactful on her social day-to-day living. ?There have been no changes in symptoms since she last saw [facility].  She denies pain, pruritus, melena, hematochezia, abdominal pain, diarrhea, constipation, or incontinence ?No discharge appreciable on exam. ? ?Assessment: Sounds like this anal discharge is stumped a number of her providers.  Etiology is unclear to me as well.  I considered an STI such as chlamydia however she has not had anal intercourse in over 10 years.  Unfortunately, there is no discharge on exam for me to swab for testing.  We did check a GC chlamydia urine which was negative.  I would consider it fairly unlikely for her to have anal chlamydia in the absence of recent anal sex and negative urine test. ? ?Plan ?- She is under the impression that she will have better insurance coverage at [facility] so I have placed another GI referral today. ?- Would continue to reevaluate at her future visits and consider STI testing if discharge would be present. ?
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
## 2021-12-22 NOTE — Progress Notes (Signed)
The results of your tests were negative. Please let me know if you have questions or concerns.

## 2021-12-22 NOTE — Progress Notes (Signed)
Internal Medicine Clinic Attending  Case discussed with Dr. Christian  At the time of the visit.  We reviewed the resident's history and exam and pertinent patient test results.  I agree with the assessment, diagnosis, and plan of care documented in the resident's note.  

## 2021-12-30 ENCOUNTER — Encounter: Payer: Self-pay | Admitting: Internal Medicine

## 2022-01-25 ENCOUNTER — Institutional Professional Consult (permissible substitution): Payer: Medicaid Other | Admitting: Behavioral Health

## 2022-01-30 ENCOUNTER — Other Ambulatory Visit: Payer: Self-pay

## 2022-01-31 MED ORDER — DULOXETINE HCL 30 MG PO CPEP: 30.0000 mg | ORAL_CAPSULE | Freq: Every day | ORAL | 0 refills | Status: DC

## 2022-03-31 DIAGNOSIS — X838XXA Intentional self-harm by other specified means, initial encounter: Secondary | ICD-10-CM | POA: Diagnosis not present

## 2022-03-31 DIAGNOSIS — S6991XA Unspecified injury of right wrist, hand and finger(s), initial encounter: Secondary | ICD-10-CM | POA: Diagnosis not present

## 2022-03-31 DIAGNOSIS — S60021A Contusion of right index finger without damage to nail, initial encounter: Secondary | ICD-10-CM | POA: Diagnosis not present

## 2022-03-31 DIAGNOSIS — M79641 Pain in right hand: Secondary | ICD-10-CM | POA: Diagnosis not present

## 2022-03-31 DIAGNOSIS — S61214A Laceration without foreign body of right ring finger without damage to nail, initial encounter: Secondary | ICD-10-CM | POA: Diagnosis not present

## 2022-03-31 DIAGNOSIS — S0081XA Abrasion of other part of head, initial encounter: Secondary | ICD-10-CM | POA: Diagnosis not present

## 2022-03-31 DIAGNOSIS — M7989 Other specified soft tissue disorders: Secondary | ICD-10-CM | POA: Diagnosis not present

## 2022-04-06 IMAGING — DX DG CHEST 2V
2 series · 2 of 2 positions shown · non-contrast
Comparison: Chest x-ray 02/05/2018.

CLINICAL DATA: 30-year-old female with history of chest pain.

EXAM:
CHEST - 2 VIEW

[chest pa]
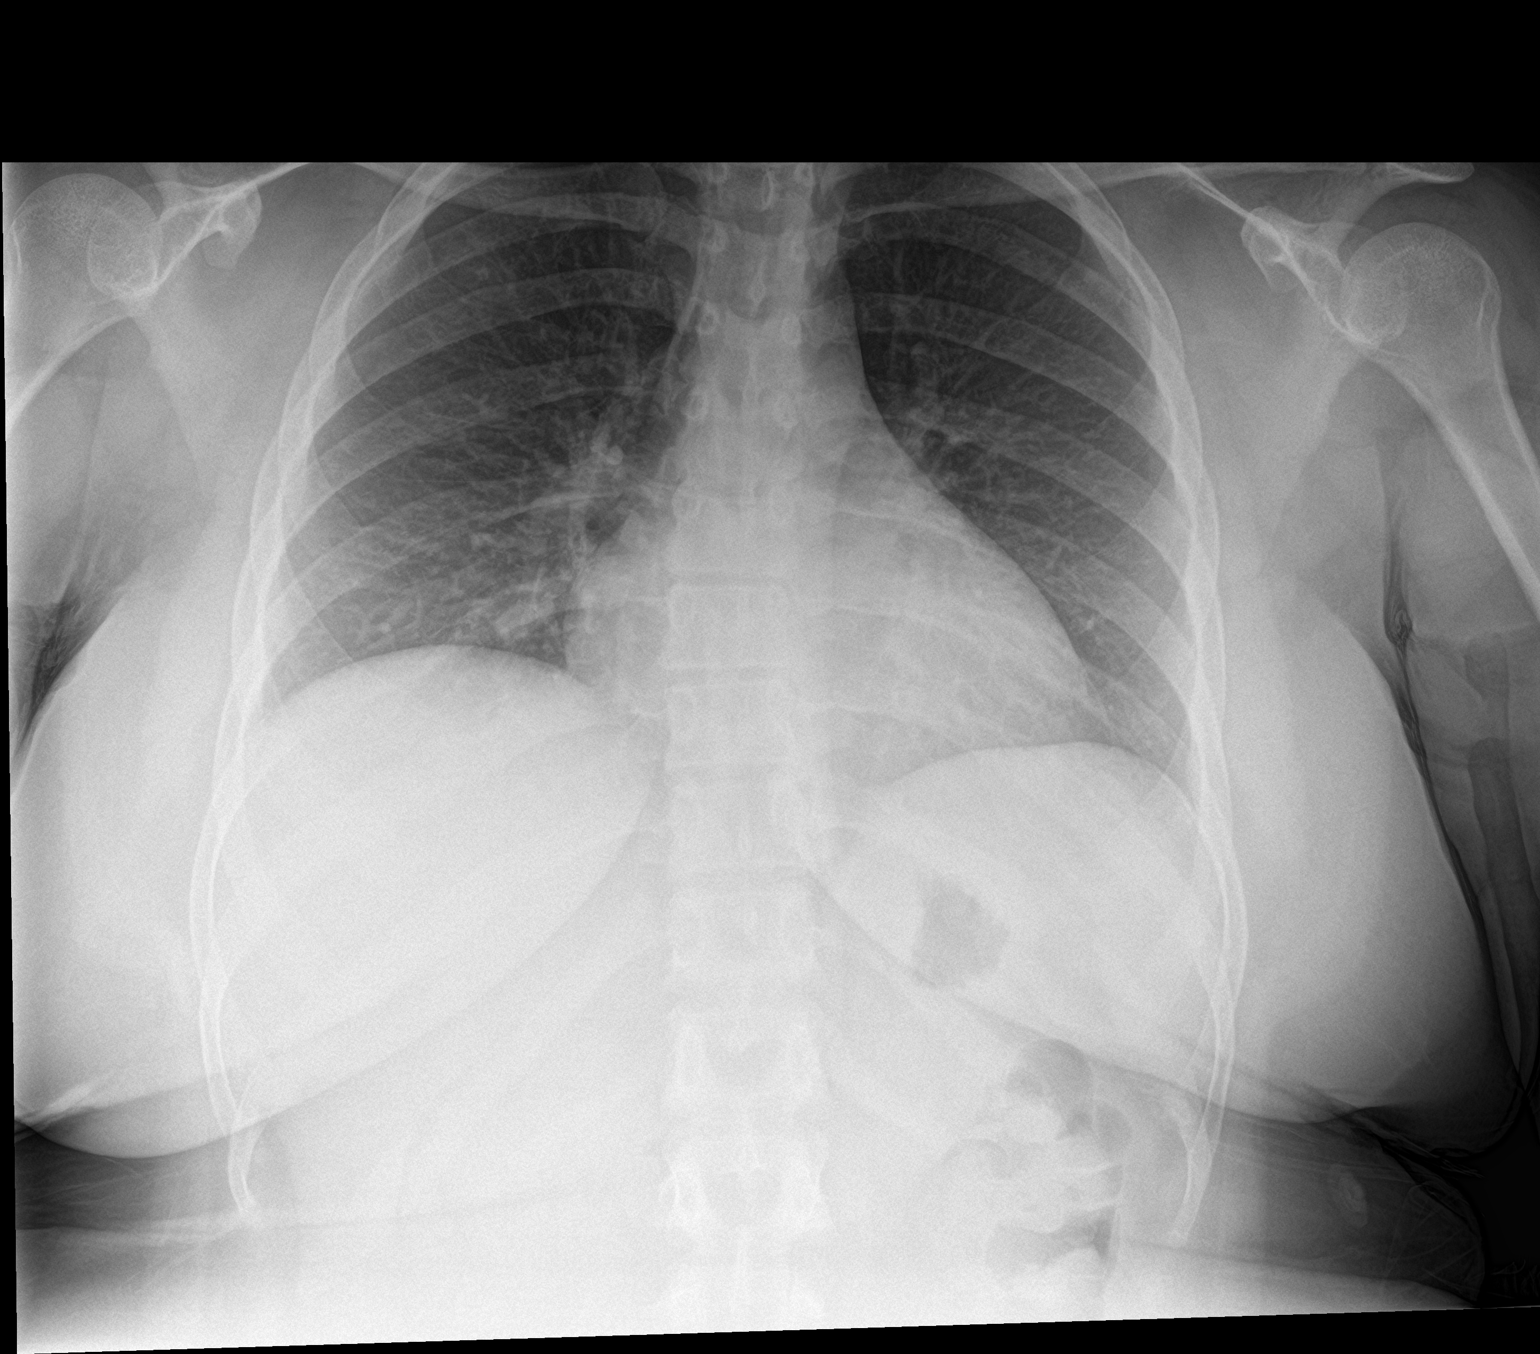

[chest lat]
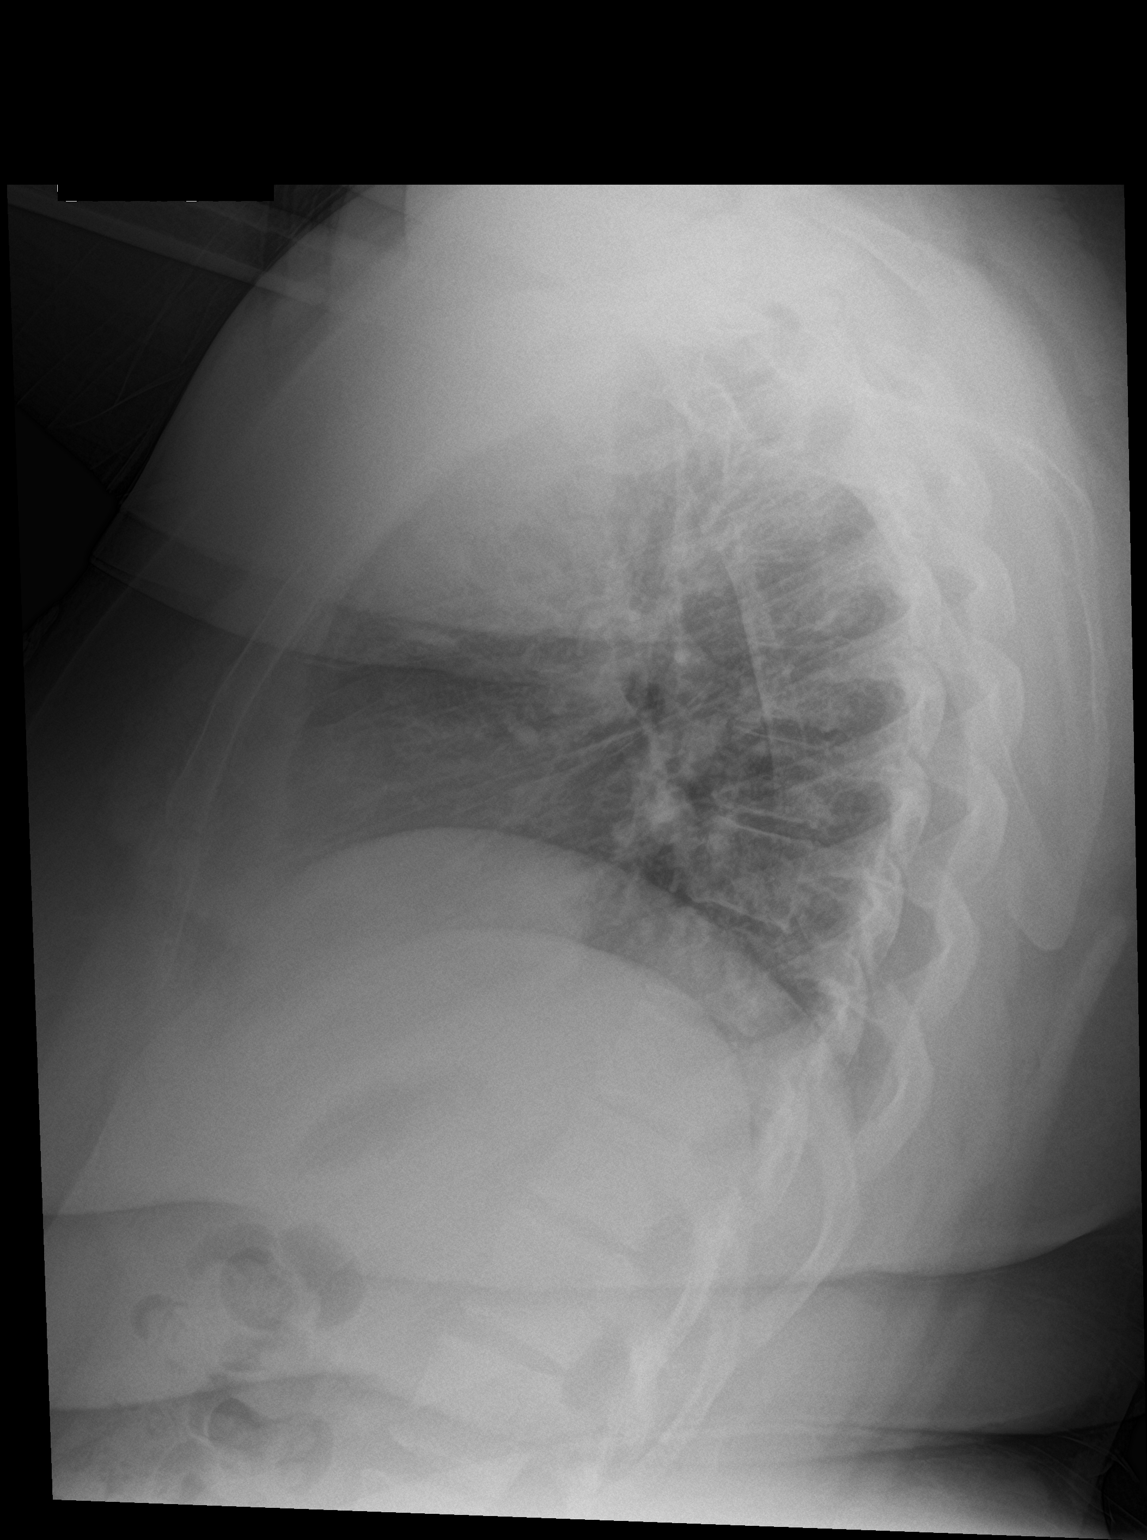

[2 of 2 positions shown; findings below may reference images not displayed]

FINDINGS: Lung volumes are normal. No consolidative airspace disease. No
pleural effusions. No pneumothorax. No pulmonary nodule or mass
noted. Pulmonary vasculature and the cardiomediastinal silhouette
are within normal limits.
IMPRESSION: No radiographic evidence of acute cardiopulmonary disease.

## 2022-05-12 DIAGNOSIS — R079 Chest pain, unspecified: Secondary | ICD-10-CM | POA: Diagnosis not present

## 2022-06-14 ENCOUNTER — Encounter: Payer: Medicaid Other | Admitting: Student

## 2022-07-01 ENCOUNTER — Inpatient Hospital Stay (HOSPITAL_COMMUNITY): Payer: BC Managed Care – PPO

## 2022-07-01 ENCOUNTER — Inpatient Hospital Stay (HOSPITAL_COMMUNITY)
Admission: AD | Admit: 2022-07-01 | Discharge: 2022-07-01 | Disposition: A | Discharge status: Home / Self Care | Payer: BC Managed Care – PPO | Attending: Obstetrics and Gynecology | Admitting: Obstetrics and Gynecology

## 2022-07-01 ENCOUNTER — Encounter (HOSPITAL_COMMUNITY): Payer: Self-pay | Admitting: Obstetrics and Gynecology

## 2022-07-01 ENCOUNTER — Other Ambulatory Visit: Payer: Self-pay

## 2022-07-01 DIAGNOSIS — O26899 Other specified pregnancy related conditions, unspecified trimester: Secondary | ICD-10-CM

## 2022-07-01 DIAGNOSIS — Z3A09 9 weeks gestation of pregnancy: Secondary | ICD-10-CM | POA: Diagnosis not present

## 2022-07-01 DIAGNOSIS — O034 Incomplete spontaneous abortion without complication: Secondary | ICD-10-CM | POA: Insufficient documentation

## 2022-07-01 LAB — URINALYSIS, ROUTINE W REFLEX MICROSCOPIC
Bilirubin Urine: NEGATIVE
Glucose, UA: NEGATIVE mg/dL
Ketones, ur: 5 mg/dL — AB
Leukocytes,Ua: NEGATIVE
Nitrite: POSITIVE — AB
Protein, ur: NEGATIVE mg/dL
Specific Gravity, Urine: 1.017 (ref 1.005–1.030)
pH: 5 (ref 5.0–8.0)

## 2022-07-01 LAB — CBC
HCT: 34.7 % — ABNORMAL LOW (ref 36.0–46.0)
Hemoglobin: 11.7 g/dL — ABNORMAL LOW (ref 12.0–15.0)
MCH: 29.5 pg (ref 26.0–34.0)
MCHC: 33.7 g/dL (ref 30.0–36.0)
MCV: 87.4 fL (ref 80.0–100.0)
Platelets: 301 10*3/uL (ref 150–400)
RBC: 3.97 MIL/uL (ref 3.87–5.11)
RDW: 12.4 % (ref 11.5–15.5)
WBC: 10.9 10*3/uL — ABNORMAL HIGH (ref 4.0–10.5)
nRBC: 0 % (ref 0.0–0.2)

## 2022-07-01 MED ORDER — MISOPROSTOL 200 MCG PO TABS: ORAL_TABLET | ORAL | 0 refills | Status: DC

## 2022-07-01 MED ORDER — OXYCODONE-ACETAMINOPHEN 5-325 MG PO TABS: 1.0000 | ORAL_TABLET | Freq: Four times a day (QID) | ORAL | 0 refills | Status: DC | PRN

## 2022-07-01 MED ORDER — HYDROMORPHONE HCL 1 MG/ML IJ SOLN
1.0000 mg | Freq: Once | INTRAMUSCULAR | Status: AC
  Administered 2022-07-01: 1 mg via INTRAMUSCULAR
  Filled 2022-07-01: qty 1

## 2022-07-01 MED ORDER — IBUPROFEN 600 MG PO TABS: 600.0000 mg | ORAL_TABLET | Freq: Four times a day (QID) | ORAL | 0 refills | Status: DC | PRN

## 2022-07-01 NOTE — MAU Provider Note (Signed)
Chief Complaint: Abdominal Pain and Vaginal Bleeding   Event Date/Time   First Provider Initiated Contact with Patient 07/01/22 1149      SUBJECTIVE HPI: Pamela Buchanan is a 31 y.o. G3P1011 at [redacted]w[redacted]d by LMP who presents to maternity admissions reporting onset of vaginal bleeding and abdominal cramping today.  She was diagnosed with missed ab/failed pregnancy on 06/26/22 with a follow up US showing gestational sac only, unchanged from [place] on 06/19/22.  She was prescribed Cytotec but has not taken the medication. She received rhogam on 11/13 in the office.  She reports her cramping lower abdominal pain is 10/10, like labor pain.   She denies dizziness or shortness of breath.  HPI  Past Medical History:  Diagnosis Date   No pertinent past medical history    Past Surgical History:  Procedure Laterality Date   COLONOSCOPY  2023   At [facility]   DILATION AND CURETTAGE OF UTERUS     Social History   Socioeconomic History   Marital status: Single    Spouse name: Not on file   Number of children: Not on file   Years of education: Not on file   Highest education level: Not on file  Occupational History   Not on file  Tobacco Use   Smoking status: Never   Smokeless tobacco: Never  Vaping Use   Vaping Use: Never used  Substance and Sexual Activity   Alcohol use: Yes    Comment: occasional socially   Drug use: No   Sexual activity: Yes    Birth control/protection: None  Other Topics Concern   Not on file  Social History Narrative   Lives with son   Right Handed   Drinks caffeine rarely   Social Determinants of Health   Financial Resource Strain: Not on file  Food Insecurity: Not on file  Transportation Needs: Not on file  Physical Activity: Not on file  Stress: Not on file  Social Connections: Not on file  Intimate Partner Violence: Not on file   No current facility-administered medications on file prior to encounter.   Current Outpatient Medications on File Prior to  Encounter  Medication Sig Dispense Refill   Multiple Vitamin (MULTIVITAMIN) tablet Take 1 tablet by mouth daily.     DULoxetine (CYMBALTA) 30 MG capsule Take 1 capsule (30 mg total) by mouth daily. 30 capsule 0   [DISCONTINUED] diphenhydrAMINE (BENADRYL) 25 MG tablet Take 1 tablet (25 mg total) by mouth every 6 (six) hours. 20 tablet 0   No Known Allergies  ROS:  Review of Systems  Constitutional:  Negative for chills, fatigue and fever.  Respiratory:  Negative for shortness of breath.   Cardiovascular:  Negative for chest pain.  Gastrointestinal:  Positive for abdominal pain.  Genitourinary:  Positive for pelvic pain and vaginal bleeding. Negative for difficulty urinating, dysuria, flank pain, vaginal discharge and vaginal pain.  Neurological:  Negative for dizziness and headaches.  Psychiatric/Behavioral: Negative.       I have reviewed patient's Past Medical Hx, Surgical Hx, Family Hx, Social Hx, medications and allergies.   Physical Exam  Patient Vitals for the past 24 hrs:  BP Temp Temp src Pulse Resp SpO2 Height Weight  07/01/22 1513 124/75 98 F (36.7 C) Oral 93 18 99 % -- --  07/01/22 1146 136/85 -- -- 86 18 98 % -- --  07/01/22 1143 -- -- -- -- -- 99 % -- --  07/01/22 1133 117/65 98.6 F (37 C) Oral 96  18 99 % 5\' 1"  (1.549 m) 104.3 kg   Constitutional: Well-developed, well-nourished female in no acute distress.  Cardiovascular: normal rate Respiratory: normal effort GI: Abd soft, non-tender. Pos BS x 4 MS: Extremities nontender, no edema, normal ROM Neurologic: Alert and oriented x 4.  GU: Neg CVAT.  PELVIC EXAM: Cervix pink, visually closed, without lesion, small amount dark red bleeding, 1 fox swab used to visualize cervix, vaginal walls and external genitalia normal    LAB RESULTS Results for orders placed or performed during the hospital encounter of 07/01/22 (from the past 24 hour(s))  Urinalysis, Routine w reflex microscopic Urine, Clean Catch     Status:  Abnormal   Collection Time: 07/01/22 11:50 AM  Result Value Ref Range   Color, Urine YELLOW YELLOW   APPearance HAZY (A) CLEAR   Specific Gravity, Urine 1.017 1.005 - 1.030   pH 5.0 5.0 - 8.0   Glucose, UA NEGATIVE NEGATIVE mg/dL   Hgb urine dipstick LARGE (A) NEGATIVE   Bilirubin Urine NEGATIVE NEGATIVE   Ketones, ur 5 (A) NEGATIVE mg/dL   Protein, ur NEGATIVE NEGATIVE mg/dL   Nitrite POSITIVE (A) NEGATIVE   Leukocytes,Ua NEGATIVE NEGATIVE   RBC / HPF 6-10 0 - 5 RBC/hpf   WBC, UA 0-5 0 - 5 WBC/hpf   Bacteria, UA RARE (A) NONE SEEN   Squamous Epithelial / LPF 0-5 0 - 5   Mucus PRESENT   CBC     Status: Abnormal   Collection Time: 07/01/22 12:22 PM  Result Value Ref Range   WBC 10.9 (H) 4.0 - 10.5 K/uL   RBC 3.97 3.87 - 5.11 MIL/uL   Hemoglobin 11.7 (L) 12.0 - 15.0 g/dL   HCT 07/03/22 (L) 52.8 - 41.3 %   MCV 87.4 80.0 - 100.0 fL   MCH 29.5 26.0 - 34.0 pg   MCHC 33.7 30.0 - 36.0 g/dL   RDW 24.4 01.0 - 27.2 %   Platelets 301 150 - 400 K/uL   nRBC 0.0 0.0 - 0.2 %       IMAGING 53.6 OB LESS THAN 14 WEEKS WITH OB TRANSVAGINAL  Result Date: 07/01/2022 CLINICAL DATA:  First trimester pregnancy.  Vaginal bleeding. EXAM: OBSTETRIC <14 WK 07/03/2022 AND TRANSVAGINAL OB US TECHNIQUE: Both transabdominal and transvaginal ultrasound examinations were performed for complete evaluation of the gestation as well as the maternal uterus, adnexal regions, and pelvic cul-de-sac. Transvaginal technique was performed to assess early pregnancy. COMPARISON:  None Available. FINDINGS: Intrauterine gestational sac: A single endometrial fluid sac is present. The fluid sac is ovoid. Yolk sac:  Not visualized Embryo:  Not visualized Cardiac Activity: Not visualized MSD: 4.7 mm mm   5 w   2 d Subchorionic hemorrhage:  None visualized. Maternal uterus/adnexae: Uterus is otherwise unremarkable. Two well-defined cysts are present in the right ovary measuring 1.7 and 1.9 cm maximally. Trace free fluid is present. IMPRESSION:  Probable early intrauterine gestational sac, but no yolk sac, fetal pole, or cardiac activity yet visualized. Recommend follow-up quantitative B-HCG levels and follow-up US in 14 days to assess viability. This recommendation follows SRU consensus guidelines: Diagnostic Criteria for Nonviable Pregnancy Early in the First Trimester. [place] Med 20132014. Electronically Signed   By: ; [id number] M.D.   On: 07/01/2022 14:34    MAU Management/MDM: Orders Placed This Encounter  Procedures   07/03/2022 OB LESS THAN 14 WEEKS WITH OB TRANSVAGINAL   Urinalysis, Routine w reflex microscopic Urine, Clean Catch   CBC  Discharge patient    Meds ordered this encounter  Medications   HYDROmorphone (DILAUDID) injection 1 mg   misoprostol (CYTOTEC) 200 MCG tablet    Sig: Place all 4 tablets into your cheek and let them soften, then swallow them.    Dispense:  3 tablet    Refill:  0    Order Specific Question:   Supervising Provider    Answer:   Jaynie Collins A [[id number]]   ibuprofen (ADVIL) 600 MG tablet    Sig: Take 1 tablet (600 mg total) by mouth every 6 (six) hours as needed.    Dispense:  30 tablet    Refill:  0    Order Specific Question:   Supervising Provider    Answer:   Jaynie Collins A [[id number]]   DISCONTD: oxyCODONE-acetaminophen (PERCOCET/ROXICET) 5-325 MG tablet    Sig: Take 1-2 tablets by mouth every 6 (six) hours as needed.    Dispense:  6 tablet    Refill:  0    Order Specific Question:   Supervising Provider    Answer:   Jaynie Collins A [[id number]]   oxyCODONE-acetaminophen (PERCOCET/ROXICET) 5-325 MG tablet    Sig: Take 1-2 tablets by mouth every 6 (six) hours as needed.    Dispense:  6 tablet    Refill:  0    Order Specific Question:   Supervising Provider    Answer:   Jaynie Collins A [[id number]]    US shows gestational sac still present. Bleeding has slowed significantly in MAU. Pt pain well controlled with Dilaudid and only mildly returned 2 hours after dosing.  Discussed  options with patient including expectant management, Cytotec Rx, or D&C scheduled next week.  Benefits/risks discussed for all options.  Given that expectant management has led to today's significant bleeding but no resolution of the pregnancy, pt opts to take Cytotec. She reports she did not take the Cytotec prescribed by her office because it was a vaginal dose.  Rx for Cytotec 600 mcg buccal sent to pharmacy along with Percocet x 6 tabs and ibuprofen 600 mg Rx.  Bleeding/miscarriage precautions given.  Message sent for pt to follow up next week at [facility].  ASSESSMENT 1. Incomplete miscarriage   2. [redacted] weeks gestation of pregnancy   3. Rh negative state in antepartum period     PLAN Discharge home Rhogam given in the office   Allergies as of 07/01/2022   No Known Allergies      Medication List     TAKE these medications    DULoxetine 30 MG capsule Commonly known as: Cymbalta Take 1 capsule (30 mg total) by mouth daily.   ibuprofen 600 MG tablet Commonly known as: ADVIL Take 1 tablet (600 mg total) by mouth every 6 (six) hours as needed.   misoprostol 200 MCG tablet Commonly known as: Cytotec Place all 4 tablets into your cheek and let them soften, then swallow them.   multivitamin tablet Take 1 tablet by mouth daily.   oxyCODONE-acetaminophen 5-325 MG tablet Commonly known as: PERCOCET/ROXICET Take 1-2 tablets by mouth every 6 (six) hours as needed.         Sharen Counter Certified Nurse-Midwife 07/01/2022  3:51 PM

## 2022-07-01 NOTE — MAU Note (Signed)
.  Pamela Buchanan is a 31 y.o. at Unknown here in MAU reporting: pain 10/10 and vaginal bleeding following known miscarriage confirmed on 11/13 per pt. Pt. Was prescribed misoprostol and picked up but did not take. Pt. States she has been passing clots but pain is uncontrollable and has gotten progressively worse. Called OB office and they recommended she be seen to confirm products of conception have been passed.  LMP: 05/22/22 or 04/23/22 pt. Is unsure Onset of complaint: 07/01/22 Pain score: 10/10 Vitals:   07/01/22 1133  BP: 117/65  Pulse: 96  Resp: 18  Temp: 98.6 F (37 C)  SpO2: 99%      Lab orders placed from triage:  UA

## 2022-07-02 ENCOUNTER — Other Ambulatory Visit: Payer: Self-pay | Admitting: Advanced Practice Midwife

## 2022-07-02 MED ORDER — MISOPROSTOL 200 MCG PO TABS: ORAL_TABLET | ORAL | 0 refills | Status: DC

## 2022-07-02 NOTE — Progress Notes (Signed)
Patient called MAU stating her pharmacy refused to fill her prescription for Cytotec because dosage did not match patient instructions. Error corrected with patient on the phone. Follow up instructions reviewed. Patient denies questions or concerns at end of call.  Clayton Bibles, MSA, MSN, CNM Certified Nurse Midwife, Chiropractor

## 2022-07-03 ENCOUNTER — Other Ambulatory Visit (HOSPITAL_BASED_OUTPATIENT_CLINIC_OR_DEPARTMENT_OTHER): Payer: Self-pay | Admitting: Advanced Practice Midwife

## 2022-07-03 DIAGNOSIS — O2341 Unspecified infection of urinary tract in pregnancy, first trimester: Secondary | ICD-10-CM

## 2022-07-03 LAB — CULTURE, OB URINE: Culture: >=100000 — AB

## 2022-07-03 MED ORDER — CEFADROXIL 500 MG PO CAPS: 500.0000 mg | ORAL_CAPSULE | Freq: Two times a day (BID) | ORAL | 0 refills | Status: AC

## 2022-12-04 DIAGNOSIS — B349 Viral infection, unspecified: Secondary | ICD-10-CM | POA: Diagnosis not present

## 2023-02-08 ENCOUNTER — Encounter: Payer: Self-pay | Admitting: Nurse Practitioner

## 2023-02-08 ENCOUNTER — Ambulatory Visit (INDEPENDENT_AMBULATORY_CARE_PROVIDER_SITE_OTHER): Payer: BC Managed Care – PPO | Admitting: Nurse Practitioner

## 2023-02-08 VITALS — BP 120/78 | HR 78 | Ht 61.0 in | Wt 234.0 lb

## 2023-02-08 DIAGNOSIS — N946 Dysmenorrhea, unspecified: Secondary | ICD-10-CM | POA: Diagnosis not present

## 2023-02-08 DIAGNOSIS — Z23 Encounter for immunization: Secondary | ICD-10-CM

## 2023-02-08 DIAGNOSIS — R350 Frequency of micturition: Secondary | ICD-10-CM | POA: Diagnosis not present

## 2023-02-08 DIAGNOSIS — Z0000 Encounter for general adult medical examination without abnormal findings: Secondary | ICD-10-CM | POA: Diagnosis not present

## 2023-02-08 LAB — POCT URINALYSIS DIP (CLINITEK)
Bilirubin, UA: NEGATIVE
Blood, UA: NEGATIVE
Glucose, UA: NEGATIVE mg/dL
Ketones, POC UA: NEGATIVE mg/dL
Leukocytes, UA: NEGATIVE
Nitrite, UA: NEGATIVE
POC PROTEIN,UA: NEGATIVE
Spec Grav, UA: 1.02 (ref 1.010–1.025)
Urobilinogen, UA: 0.2 E.U./dL
pH, UA: 6.5 (ref 5.0–8.0)

## 2023-02-08 MED ORDER — BACLOFEN 20 MG PO TABS: 20.0000 mg | ORAL_TABLET | Freq: Three times a day (TID) | ORAL | 2 refills | Status: DC

## 2023-02-08 MED ORDER — IBUPROFEN 800 MG PO TABS
800.0000 mg | ORAL_TABLET | Freq: Three times a day (TID) | ORAL | 0 refills | Status: DC | PRN
Start: 2023-02-08 — End: 2024-01-14

## 2023-02-08 MED ORDER — NORETHINDRONE ACET-ETHINYL EST 1.5-30 MG-MCG PO TABS: 1.0000 | ORAL_TABLET | Freq: Every day | ORAL | 3 refills | Status: DC

## 2023-02-08 NOTE — Progress Notes (Signed)
Shawna Clamp, DNP, AGNP-c [facility] [address] [place], [place] [address] Main Office [phone]  BP 120/78   Pulse 78   Ht 5\' 1"  (1.549 m)   Wt 234 lb (106.1 kg)   LMP  (LMP Unknown)   Breastfeeding Unknown   BMI 44.21 kg/m    Subjective:    Patient ID: Pamela Buchanan, female    DOB: Dec 14, 1990, 32 y.o.   MRN: [id number]  HPI: Pamela Buchanan is a 32 y.o. female presenting on 02/08/2023 for comprehensive medical examination.   Current medical concerns include: Pamela Buchanan, a 32 year old patient, presents today for a school-required physical and to discuss immunization updates. She mentions needing a COVID booster as per her school program's requirements, noting her last Pfizer vaccine was administered in 2021. 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  Pamela Buchanan also discusses her immunization history, indicating she has had the first dose of varicella and MMR vaccines last year but is unsure about her immunity levels. She recalls having titers done previously but cannot remember the results, suggesting she might need another titer for both.  She reports a history of heavy and painful menstrual cycles, which have worsened since a miscarriage in November of the previous year. The pain was severe enough to cause her to leave work and manage it with over-the-counter medications and a leftover prescription of oxycodone. She plans to see a gynecologist soon and is considering starting birth control pills to manage her symptoms until then.   Pamela Buchanan mentions a past miscarriage and her current status of not needing to have more children. She expresses interest in possibly using an IUD again for birth control, having had positive experiences with it in the past.  She is also concerned about frequent urination, though she mentions it might be due to increased water intake. She denies  any chest pain or difficulty breathing and describes her general health as good, despite needing to manage her diabetes and lose weight.   Pertinent items are noted in HPI.  IMMUNIZATIONS:   Flu: Flu vaccine postponed until flu season Prevnar 13: Prevnar 13 N/A for this patient Prevnar 20: Prevnar 20 N/A for this patient Pneumovax 23: Pneumovax 23 N/A for this patient Vac Shingrix: Shingrix N/A for this patient HPV: HPV N/A for this patient Tetanus: Tetanus completed in the last 10 years COVID: COVID completed, documentation in chart   HEALTH MAINTENANCE: Pap Smear HM Status: is up to date Mammogram HM Status: is not applicable for this patient Colon Cancer Screening HM Status: is not applicable for this patient Bone Density HM Status: is not applicable for this patient STI Testing HM Status: was declined  Lung CT HM Status: is not applicable for this patient  She reports regular vision exams q1-5y: Yes  She reports regular dental exams q 59m:  Yes  The patient eats a regular, healthy diet. She endorses exercise and/or activity of: walking  Most Recent Depression Screen:     02/08/2023    2:30 PM 12/19/2021    1:51 PM  Depression screen PHQ 2/9  Decreased Interest 0 2  Down, Depressed, Hopeless 0 2  PHQ - 2 Score 0 4  Altered sleeping  2  Tired, decreased energy  3  Change in appetite  3  Feeling bad or failure about yourself   0  Trouble concentrating  3  Moving slowly or fidgety/restless  1  Suicidal  thoughts  0  PHQ-9 Score  16  Difficult doing work/chores  Somewhat difficult   Most Recent Anxiety Screen:     12/19/2021    1:52 PM  GAD 7 : Generalized Anxiety Score  Nervous, Anxious, on Edge 1  Control/stop worrying 1  Worry too much - different things 2  Trouble relaxing 2  Restless 0  Easily annoyed or irritable 2  Afraid - awful might happen 0  Total GAD 7 Score 8   Most Recent Fall Screen:    02/08/2023    2:30 PM  Fall Risk   Falls in the past year?  0  Number falls in past yr: 0  Injury with Fall? 0  Risk for fall due to : No Fall Risks  Follow up Falls evaluation completed    Past medical history, surgical history, medications, allergies, family history and social history reviewed with patient today and changes made to appropriate areas of the chart.  Past Medical History:  Past Medical History:  Diagnosis Date   No pertinent past medical history    Medications:  Current Outpatient Medications on File Prior to Visit  Medication Sig   Multiple Vitamin (MULTIVITAMIN) tablet Take 1 tablet by mouth daily.   [DISCONTINUED] diphenhydrAMINE (BENADRYL) 25 MG tablet Take 1 tablet (25 mg total) by mouth every 6 (six) hours.   No current facility-administered medications on file prior to visit.   Surgical History:  Past Surgical History:  Procedure Laterality Date   COLONOSCOPY  2023   At [facility]   DILATION AND CURETTAGE OF UTERUS     Allergies:  No Known Allergies Family History:  Family History  Problem Relation Age of Onset   Healthy Mother    Hypertension Father        Objective:    BP 120/78   Pulse 78   Ht 5\' 1"  (1.549 m)   Wt 234 lb (106.1 kg)   LMP  (LMP Unknown)   Breastfeeding Unknown   BMI 44.21 kg/m   Wt Readings from Last 3 Encounters:  02/08/23 234 lb (106.1 kg)  07/01/22 230 lb (104.3 kg)  11/11/21 220 lb (99.8 kg)    Physical Exam Vitals and nursing note reviewed.  Constitutional:      General: She is not in acute distress.    Appearance: Normal appearance.  HENT:     Head: Normocephalic and atraumatic.     Right Ear: Hearing, tympanic membrane, ear canal and external ear normal.     Left Ear: Hearing, tympanic membrane, ear canal and external ear normal.     Nose: Nose normal.     Right Sinus: No maxillary sinus tenderness or frontal sinus tenderness.     Left Sinus: No maxillary sinus tenderness or frontal sinus tenderness.     Mouth/Throat:     Lips: Pink.     Mouth: Mucous membranes  are moist.     Pharynx: Oropharynx is clear.  Eyes:     General: Lids are normal. Vision grossly intact.     Extraocular Movements: Extraocular movements intact.     Conjunctiva/sclera: Conjunctivae normal.     Pupils: Pupils are equal, round, and reactive to light.     Funduscopic exam:    Right eye: Red reflex present.        Left eye: Red reflex present.    Visual Fields: Right eye visual fields normal and left eye visual fields normal.  Neck:     Thyroid: No thyromegaly.  Vascular: No carotid bruit.  Cardiovascular:     Rate and Rhythm: Normal rate and regular rhythm.     Chest Wall: PMI is not displaced.     Pulses: Normal pulses.          Dorsalis pedis pulses are 2+ on the right side and 2+ on the left side.       Posterior tibial pulses are 2+ on the right side and 2+ on the left side.     Heart sounds: Normal heart sounds. No murmur heard. Pulmonary:     Effort: Pulmonary effort is normal. No respiratory distress.     Breath sounds: Normal breath sounds.  Abdominal:     General: Abdomen is flat. Bowel sounds are normal. There is no distension.     Palpations: Abdomen is soft. There is no hepatomegaly, splenomegaly or mass.     Tenderness: There is no abdominal tenderness. There is no right CVA tenderness, left CVA tenderness, guarding or rebound.  Musculoskeletal:        General: Normal range of motion.     Cervical back: Full passive range of motion without pain, normal range of motion and neck supple. No tenderness.     Right lower leg: No edema.     Left lower leg: No edema.  Feet:     Left foot:     Toenail Condition: Left toenails are normal.  Lymphadenopathy:     Cervical: No cervical adenopathy.     Upper Body:     Right upper body: No supraclavicular adenopathy.     Left upper body: No supraclavicular adenopathy.  Skin:    General: Skin is warm and dry.     Capillary Refill: Capillary refill takes less than 2 seconds.     Nails: There is no clubbing.   Neurological:     General: No focal deficit present.     Mental Status: She is alert and oriented to person, place, and time.     GCS: GCS eye subscore is 4. GCS verbal subscore is 5. GCS motor subscore is 6.     Sensory: Sensation is intact.     Motor: Motor function is intact.     Coordination: Coordination is intact.     Gait: Gait is intact.     Deep Tendon Reflexes: Reflexes are normal and symmetric.  Psychiatric:        Attention and Perception: Attention normal.        Mood and Affect: Mood normal.        Speech: Speech normal.        Behavior: Behavior normal. Behavior is cooperative.        Thought Content: Thought content normal.        Cognition and Memory: Cognition and memory normal.        Judgment: Judgment normal.     Results for orders placed or performed in visit on 02/08/23  Hemoglobin A1c  Result Value Ref Range   Hgb A1c MFr Bld 5.3 4.8 - 5.6 %   Est. average glucose Bld gHb Est-mCnc 105 mg/dL  Comprehensive metabolic panel  Result Value Ref Range   Glucose 91 70 - 99 mg/dL   BUN 7 6 - 20 mg/dL   Creatinine, Ser 1.32 0.57 - 1.00 mg/dL   eGFR 97 >44 WN/UUV/2.53   BUN/Creatinine Ratio 9 9 - 23   Sodium 139 134 - 144 mmol/L   Potassium 4.3 3.5 - 5.2 mmol/L   Chloride 102 96 -  106 mmol/L   CO2 23 20 - 29 mmol/L   Calcium 9.4 8.7 - 10.2 mg/dL   Total Protein 7.2 6.0 - 8.5 g/dL   Albumin 4.3 3.9 - 4.9 g/dL   Globulin, Total 2.9 1.5 - 4.5 g/dL   Bilirubin Total <1.6 0.0 - 1.2 mg/dL   Alkaline Phosphatase 84 44 - 121 IU/L   AST 12 0 - 40 IU/L   ALT 11 0 - 32 IU/L  CBC with Differential/Platelet  Result Value Ref Range   WBC 12.3 (H) 3.4 - 10.8 x10E3/uL   RBC 4.20 3.77 - 5.28 x10E6/uL   Hemoglobin 12.4 11.1 - 15.9 g/dL   Hematocrit 10.9 60.4 - 46.6 %   MCV 87 79 - 97 fL   MCH 29.5 26.6 - 33.0 pg   MCHC 34.1 31.5 - 35.7 g/dL   RDW 54.0 98.1 - 19.1 %   Platelets 337 150 - 450 x10E3/uL   Neutrophils 65 Not Estab. %   Lymphs 28 Not Estab. %    Monocytes 6 Not Estab. %   Eos 1 Not Estab. %   Basos 0 Not Estab. %   Neutrophils Absolute 7.9 (H) 1.4 - 7.0 x10E3/uL   Lymphocytes Absolute 3.5 (H) 0.7 - 3.1 x10E3/uL   Monocytes Absolute 0.8 0.1 - 0.9 x10E3/uL   EOS (ABSOLUTE) 0.1 0.0 - 0.4 x10E3/uL   Basophils Absolute 0.0 0.0 - 0.2 x10E3/uL   Immature Granulocytes 0 Not Estab. %   Immature Grans (Abs) 0.0 0.0 - 0.1 x10E3/uL  Iron, TIBC and Ferritin Panel  Result Value Ref Range   Total Iron Binding Capacity 321 250 - 450 ug/dL   UIBC 478 295 - 621 ug/dL   Iron 45 27 - 308 ug/dL   Iron Saturation 14 (L) 15 - 55 %   Ferritin 128 15 - 150 ng/mL  VITAMIN D 25 Hydroxy (Vit-D Deficiency, Fractures)  Result Value Ref Range   Vit D, 25-Hydroxy 15.6 (L) 30.0 - 100.0 ng/mL  TSH  Result Value Ref Range   TSH 2.080 0.450 - 4.500 uIU/mL  Varicella zoster antibody, IgG  Result Value Ref Range   Varicella zoster IgG 1,172 Immune >165 index  QuantiFERON-TB Gold Plus  Result Value Ref Range   QuantiFERON Incubation Incubation performed.    QuantiFERON Criteria Comment    QuantiFERON TB1 Ag Value 0.11 IU/mL   QuantiFERON TB2 Ag Value 0.13 IU/mL   QuantiFERON Nil Value 0.10 IU/mL   QuantiFERON Mitogen Value >10.00 IU/mL   QuantiFERON-TB Gold Plus Incubation performed. Negative  POCT URINALYSIS DIP (CLINITEK)  Result Value Ref Range   Color, UA yellow yellow   Clarity, UA clear clear   Glucose, UA negative negative mg/dL   Bilirubin, UA negative negative   Ketones, POC UA negative negative mg/dL   Spec Grav, UA 6.578 4.696 - 1.025   Blood, UA negative negative   pH, UA 6.5 5.0 - 8.0   POC PROTEIN,UA negative negative, trace   Urobilinogen, UA 0.2 0.2 or 1.0 E.U./dL   Nitrite, UA Negative Negative   Leukocytes, UA Negative Negative         Assessment & Plan:   Problem List Items Addressed This Visit     Encounter for annual physical exam - Primary    CPE completed today.   Labs ordered. Will make changes as necessary based  on results.  Review of HM activities and recommendations discussed and provided on AVS. Anticipatory guidance, diet, and exercise recommendations provided.  Medications, allergies,  and hx reviewed and updated as necessary.  Plan to f/u with CPE in 1 year or sooner for acute/chronic health needs as directed.        Relevant Medications   Norethindrone Acetate-Ethinyl Estradiol (LOESTRIN 1.5/30, 21,) 1.5-30 MG-MCG tablet   baclofen (LIORESAL) 20 MG tablet   Other Relevant Orders   Hemoglobin A1c (Completed)   Comprehensive metabolic panel (Completed)   CBC with Differential/Platelet (Completed)   Iron, TIBC and Ferritin Panel (Completed)   VITAMIN D 25 Hydroxy (Vit-D Deficiency, Fractures) (Completed)   TSH (Completed)   Varicella zoster antibody, IgG (Completed)   QuantiFERON-TB Gold Plus (Completed)   Severe dysmenorrhea    Pamela Buchanan reports severe menstrual cramps leading to significant discomfort and functional impairment. She has a history of using various birth control methods for cycle management. Plan: - Prescribe an oral contraceptive pill to manage her menstrual symptoms and potentially skip the placebo phase to avoid menstruation until she can consult with her gynecologist - Discuss the reintroduction of an IUD with her gynecologist at her next visit      Relevant Medications   Norethindrone Acetate-Ethinyl Estradiol (LOESTRIN 1.5/30, 21,) 1.5-30 MG-MCG tablet   baclofen (LIORESAL) 20 MG tablet   ibuprofen (ADVIL) 800 MG tablet   Frequent urination    Labs pending for blood sugar. No cystitis present.       Relevant Orders   POCT URINALYSIS DIP (CLINITEK) (Completed)   Other Visit Diagnoses     Health care maintenance       Relevant Orders   Hemoglobin A1c (Completed)   Comprehensive metabolic panel (Completed)   CBC with Differential/Platelet (Completed)   Iron, TIBC and Ferritin Panel (Completed)   VITAMIN D 25 Hydroxy (Vit-D Deficiency, Fractures) (Completed)    TSH (Completed)   Varicella zoster antibody, IgG (Completed)   QuantiFERON-TB Gold Plus (Completed)   Need for COVID-19 vaccine       Relevant Orders   Pfizer Fall 2023 Covid-19 Vaccine 28yrs and older (Completed)          Follow up plan: Return in about 1 year (around 02/08/2024) for CPE.  NEXT PREVENTATIVE PHYSICAL DUE IN 1 YEAR.  PATIENT COUNSELING PROVIDED FOR ALL ADULT PATIENTS: A well balanced diet low in saturated fats, cholesterol, and moderation in carbohydrates.  This can be as simple as monitoring portion sizes and cutting back on sugary beverages such as soda and juice to start with.    Daily water consumption of at least 64 ounces.  Physical activity at least 180 minutes per week.  If just starting out, start 10 minutes a day and work your way up.   This can be as simple as taking the stairs instead of the elevator and walking 2-3 laps around the office  purposefully every day.   STD protection, partner selection, and regular testing if high risk.  Limited consumption of alcoholic beverages if alcohol is consumed. For men, I recommend no more than 14 alcoholic beverages per week, spread out throughout the week (max 2 per day). Avoid "binge" drinking or consuming large quantities of alcohol in one setting.  Please let me know if you feel you may need help with reduction or quitting alcohol consumption.   Avoidance of nicotine, if used. Please let me know if you feel you may need help with reduction or quitting nicotine use.   Daily mental health attention. This can be in the form of 5 minute daily meditation, prayer, journaling, yoga, reflection, etc.  Purposeful attention to  your emotions and mental state can significantly improve your overall wellbeing  and  Health.  Please know that I am here to help you with all of your health care goals and am happy to work with you to find a solution that works best for you.  The greatest advice I have received with any  changes in life are to take it one step at a time, that even means if all you can focus on is the next 60 seconds, then do that and celebrate your victories.  With any changes in life, you will have set backs, and that is OK. The important thing to remember is, if you have a set back, it is not a failure, it is an opportunity to try again! Screening Testing Mammogram Every 1 -2 years based on history and risk factors Starting at age 11 Pap Smear Ages 21-39 every 3 years Ages 49-65 every 5 years with HPV testing More frequent testing may be required based on results and history Colon Cancer Screening Every 1-10 years based on test performed, risk factors, and history Starting at age 16 Bone Density Screening Every 2-10 years based on history Starting at age 33 for women Recommendations for men differ based on medication usage, history, and risk factors AAA Screening One time ultrasound Men 21-47 years old who have every smoked Lung Cancer Screening Low Dose Lung CT every 12 months Age 54-80 years with a 30 pack-year smoking history who still smoke or who have quit within the last 15 years   Screening Labs Routine  Labs: Complete Blood Count (CBC), Complete Metabolic Panel (CMP), Cholesterol (Lipid Panel) Every 6-12 months based on history and medications May be recommended more frequently based on current conditions or previous results Hemoglobin A1c Lab Every 3-12 months based on history and previous results Starting at age 14 or earlier with diagnosis of diabetes, high cholesterol, BMI >26, and/or risk factors Frequent monitoring for patients with diabetes to ensure blood sugar control Thyroid Panel (TSH) Every 6 months based on history, symptoms, and risk factors May be repeated more often if on medication HIV One time testing for all patients 20 and older May be repeated more frequently for patients with increased risk factors or exposure Hepatitis C One time testing for  all patients 8 and older May be repeated more frequently for patients with increased risk factors or exposure Gonorrhea, Chlamydia Every 12 months for all sexually active persons 13-24 years Additional monitoring may be recommended for those who are considered high risk or who have symptoms Every 12 months for any woman on birth control, regardless of sexual activity PSA Men 44-71 years old with risk factors Additional screening may be recommended from age 70-69 based on risk factors, symptoms, and history  Vaccine Recommendations Tetanus Booster All adults every 10 years Flu Vaccine All patients 6 months and older every year COVID Vaccine All patients 12 years and older Initial dosing with booster May recommend additional booster based on age and health history HPV Vaccine 2 doses all patients age 48-26 Dosing may be considered for patients over 26 Shingles Vaccine (Shingrix) 2 doses all adults 55 years and older Pneumonia (Pneumovax 62) All adults 65 years and older May recommend earlier dosing based on health history One year apart from Prevnar 32 Pneumonia (Prevnar 22) All adults 65 years and older Dosed 1 year after Pneumovax 23 Pneumonia (Prevnar 20) One time alternative to the two dosing of 13 and 23 For all adults with initial dose  of 23, 20 is recommended 1 year later For all adults with initial dose of 13, 23 is still recommended as second option 1 year later

## 2023-02-08 NOTE — Patient Instructions (Signed)

## 2023-02-09 LAB — QUANTIFERON-TB GOLD PLUS

## 2023-02-14 ENCOUNTER — Other Ambulatory Visit: Payer: Self-pay | Admitting: Nurse Practitioner

## 2023-02-14 DIAGNOSIS — E559 Vitamin D deficiency, unspecified: Secondary | ICD-10-CM | POA: Insufficient documentation

## 2023-02-14 DIAGNOSIS — D72820 Lymphocytosis (symptomatic): Secondary | ICD-10-CM

## 2023-02-14 MED ORDER — VITAMIN D (ERGOCALCIFEROL) 1.25 MG (50000 UNIT) PO CAPS
50000.0000 [IU] | ORAL_CAPSULE | ORAL | 3 refills | Status: DC
Start: 2023-02-14 — End: 2024-01-14

## 2023-02-16 LAB — CBC WITH DIFFERENTIAL/PLATELET
Basophils Absolute: 0 10*3/uL (ref 0.0–0.2)
Basos: 0 %
EOS (ABSOLUTE): 0.1 10*3/uL (ref 0.0–0.4)
Eos: 1 %
Hematocrit: 36.4 % (ref 34.0–46.6)
Hemoglobin: 12.4 g/dL (ref 11.1–15.9)
Immature Grans (Abs): 0 10*3/uL (ref 0.0–0.1)
Immature Granulocytes: 0 %
Lymphocytes Absolute: 3.5 10*3/uL — ABNORMAL HIGH (ref 0.7–3.1)
Lymphs: 28 %
MCH: 29.5 pg (ref 26.6–33.0)
MCHC: 34.1 g/dL (ref 31.5–35.7)
MCV: 87 fL (ref 79–97)
Monocytes Absolute: 0.8 10*3/uL (ref 0.1–0.9)
Monocytes: 6 %
Neutrophils Absolute: 7.9 10*3/uL — ABNORMAL HIGH (ref 1.4–7.0)
Neutrophils: 65 %
Platelets: 337 10*3/uL (ref 150–450)
RBC: 4.2 x10E6/uL (ref 3.77–5.28)
RDW: 12.3 % (ref 11.7–15.4)
WBC: 12.3 10*3/uL — ABNORMAL HIGH (ref 3.4–10.8)

## 2023-02-16 LAB — COMPREHENSIVE METABOLIC PANEL
ALT: 11 IU/L (ref 0–32)
AST: 12 IU/L (ref 0–40)
Albumin: 4.3 g/dL (ref 3.9–4.9)
Alkaline Phosphatase: 84 IU/L (ref 44–121)
BUN/Creatinine Ratio: 9 (ref 9–23)
BUN: 7 mg/dL (ref 6–20)
Bilirubin Total: <0.2 mg/dL (ref 0.0–1.2)
CO2: 23 mmol/L (ref 20–29)
Calcium: 9.4 mg/dL (ref 8.7–10.2)
Chloride: 102 mmol/L (ref 96–106)
Creatinine, Ser: 0.82 mg/dL (ref 0.57–1.00)
Globulin, Total: 2.9 g/dL (ref 1.5–4.5)
Glucose: 91 mg/dL (ref 70–99)
Potassium: 4.3 mmol/L (ref 3.5–5.2)
Sodium: 139 mmol/L (ref 134–144)
Total Protein: 7.2 g/dL (ref 6.0–8.5)
eGFR: 97 mL/min/{1.73_m2} (ref >59)

## 2023-02-16 LAB — HEMOGLOBIN A1C
Est. average glucose Bld gHb Est-mCnc: 105 mg/dL
Hgb A1c MFr Bld: 5.3 % (ref 4.8–5.6)

## 2023-02-16 LAB — IRON,TIBC AND FERRITIN PANEL
Ferritin: 128 ng/mL (ref 15–150)
Iron Saturation: 14 % — ABNORMAL LOW (ref 15–55)
Iron: 45 ug/dL (ref 27–159)
Total Iron Binding Capacity: 321 ug/dL (ref 250–450)
UIBC: 276 ug/dL (ref 131–425)

## 2023-02-16 LAB — QUANTIFERON-TB GOLD PLUS
QuantiFERON Mitogen Value: >10 IU/mL
QuantiFERON Nil Value: 0.1 IU/mL
QuantiFERON TB1 Ag Value: 0.11 IU/mL
QuantiFERON TB2 Ag Value: 0.13 IU/mL

## 2023-02-16 LAB — TSH: TSH: 2.08 u[IU]/mL (ref 0.450–4.500)

## 2023-02-16 LAB — VARICELLA ZOSTER ANTIBODY, IGG: Varicella zoster IgG: 1172 index (ref >165)

## 2023-02-16 LAB — VITAMIN D 25 HYDROXY (VIT D DEFICIENCY, FRACTURES): Vit D, 25-Hydroxy: 15.6 ng/mL — ABNORMAL LOW (ref 30.0–100.0)

## 2023-03-14 DIAGNOSIS — Z0000 Encounter for general adult medical examination without abnormal findings: Secondary | ICD-10-CM | POA: Insufficient documentation

## 2023-03-14 DIAGNOSIS — N946 Dysmenorrhea, unspecified: Secondary | ICD-10-CM | POA: Insufficient documentation

## 2023-03-14 DIAGNOSIS — R350 Frequency of micturition: Secondary | ICD-10-CM | POA: Insufficient documentation

## 2023-03-14 DIAGNOSIS — Z113 Encounter for screening for infections with a predominantly sexual mode of transmission: Secondary | ICD-10-CM | POA: Insufficient documentation

## 2023-03-14 NOTE — Assessment & Plan Note (Signed)
CPE completed today.   Labs ordered. Will make changes as necessary based on results.  Review of HM activities and recommendations discussed and provided on AVS Anticipatory guidance, diet, and exercise recommendations provided.  Medications, allergies, and hx reviewed and updated as necessary.  Plan to f/u with CPE in 1 year or sooner for acute/chronic health needs as directed.   

## 2023-03-14 NOTE — Assessment & Plan Note (Signed)
Labs pending for blood sugar. No cystitis present.

## 2023-03-14 NOTE — Assessment & Plan Note (Signed)
Pamela Buchanan reports severe menstrual cramps leading to significant discomfort and functional impairment. She has a history of using various birth control methods for cycle management. Plan: - Prescribe an oral contraceptive pill to manage her menstrual symptoms and potentially skip the placebo phase to avoid menstruation until she can consult with her gynecologist - Discuss the reintroduction of an IUD with her gynecologist at her next visit

## 2023-03-23 ENCOUNTER — Other Ambulatory Visit: Payer: BC Managed Care – PPO

## 2023-03-23 DIAGNOSIS — R079 Chest pain, unspecified: Secondary | ICD-10-CM | POA: Diagnosis not present

## 2023-03-23 DIAGNOSIS — D72820 Lymphocytosis (symptomatic): Secondary | ICD-10-CM

## 2023-03-23 DIAGNOSIS — R002 Palpitations: Secondary | ICD-10-CM | POA: Diagnosis not present

## 2023-03-23 DIAGNOSIS — D72828 Other elevated white blood cell count: Secondary | ICD-10-CM

## 2023-03-23 LAB — CBC WITH DIFFERENTIAL/PLATELET
Basophils Absolute: 0 10*3/uL (ref 0.0–0.2)
Basos: 0 %
EOS (ABSOLUTE): 0.1 10*3/uL (ref 0.0–0.4)
Eos: 1 %
Hematocrit: 39 % (ref 34.0–46.6)
Hemoglobin: 12.7 g/dL (ref 11.1–15.9)
Immature Grans (Abs): 0 10*3/uL (ref 0.0–0.1)
Immature Granulocytes: 0 %
Lymphocytes Absolute: 2.9 10*3/uL (ref 0.7–3.1)
Lymphs: 23 %
MCH: 29.1 pg (ref 26.6–33.0)
MCHC: 32.6 g/dL (ref 31.5–35.7)
MCV: 89 fL (ref 79–97)
Monocytes Absolute: 0.7 10*3/uL (ref 0.1–0.9)
Monocytes: 6 %
Neutrophils Absolute: 9.1 10*3/uL — ABNORMAL HIGH (ref 1.4–7.0)
Neutrophils: 70 %
Platelets: 318 10*3/uL (ref 150–450)
RBC: 4.37 x10E6/uL (ref 3.77–5.28)
RDW: 12.2 % (ref 11.7–15.4)
WBC: 13 10*3/uL — ABNORMAL HIGH (ref 3.4–10.8)

## 2023-04-03 DIAGNOSIS — D72828 Other elevated white blood cell count: Secondary | ICD-10-CM | POA: Insufficient documentation

## 2023-04-04 ENCOUNTER — Other Ambulatory Visit: Payer: Self-pay

## 2023-04-04 DIAGNOSIS — D729 Disorder of white blood cells, unspecified: Secondary | ICD-10-CM

## 2023-04-10 DIAGNOSIS — R0602 Shortness of breath: Secondary | ICD-10-CM | POA: Diagnosis not present

## 2023-04-10 DIAGNOSIS — R0981 Nasal congestion: Secondary | ICD-10-CM | POA: Diagnosis not present

## 2023-04-10 DIAGNOSIS — R52 Pain, unspecified: Secondary | ICD-10-CM | POA: Diagnosis not present

## 2023-04-10 DIAGNOSIS — R059 Cough, unspecified: Secondary | ICD-10-CM | POA: Diagnosis not present

## 2023-04-10 DIAGNOSIS — R5383 Other fatigue: Secondary | ICD-10-CM | POA: Diagnosis not present

## 2023-04-12 ENCOUNTER — Telehealth: Payer: Self-pay

## 2023-04-12 ENCOUNTER — Other Ambulatory Visit: Payer: Self-pay

## 2023-04-12 MED ORDER — FLUCONAZOLE 150 MG PO TABS: 150.0000 mg | ORAL_TABLET | Freq: Once | ORAL | 0 refills | Status: AC

## 2023-04-12 NOTE — Telephone Encounter (Signed)
States she has covid and pneumonia per the urgent care om 04/10/23 so she is on a lot of antibiotics. Asking for an rx for a yeast infection. States she does not have an infection but is experiencing irritation in vaginal area from antibiotics.

## 2023-04-30 ENCOUNTER — Encounter: Payer: Self-pay | Admitting: Nurse Practitioner

## 2023-04-30 ENCOUNTER — Inpatient Hospital Stay: Payer: BC Managed Care – PPO | Attending: Nurse Practitioner | Admitting: Nurse Practitioner

## 2023-04-30 ENCOUNTER — Inpatient Hospital Stay: Payer: BC Managed Care – PPO

## 2023-04-30 VITALS — BP 102/69 | HR 79 | Temp 98.3°F | Resp 17 | Ht 61.0 in | Wt 231.0 lb

## 2023-04-30 DIAGNOSIS — D72829 Elevated white blood cell count, unspecified: Secondary | ICD-10-CM | POA: Diagnosis not present

## 2023-04-30 DIAGNOSIS — N920 Excessive and frequent menstruation with regular cycle: Secondary | ICD-10-CM | POA: Insufficient documentation

## 2023-04-30 NOTE — Progress Notes (Addendum)
[facility]   [phone] [phone]   Clinic New consult Note   Patient Care Team: Early, Sung Amabile, NP as PCP - General (Nurse Practitioner) 04/30/2023  CHIEF COMPLAINTS/PURPOSE OF CONSULTATION:  Leukocytosis, referred by PCP Enid Skeens, NP  HISTORY OF PRESENTING ILLNESS:  Pamela Buchanan 32 y.o. female with PMH including seasonal allergies and menorrhagia is here because of elevated white blood cell count. CCBC from 07/13/2007 showed WBC 13.9, which was persistently elevated in 01/2015 11.5 - 13, but normal in 06/2015 - 07/2017 in 8-9 range. CBC wit diff was normal on 05/12/22. She had covid pneumonia in late 03/2023, and had pneumonia annually for past 3 years. She has periodic lymphocytosis with abs lymphs up to 4.9 and neutrophilia which are intermittently normal. Most recent CBC 03/23/23 showed WBC 13.0, ANC 9.1, normal absolute lymphocytes. She has seasonal allergies. No steroid use. Non-smoker.   Socially, she is G3P42 with an 44 year old son, 2 miscarriages with <14 weeks. She works as a Clinical biochemist. Drinks alcohol rarely, no smoking or other drug use. Denies family history of leukocytosis, blood count issues, or cancer.   Today she presents by herself. She has recovered from covid/PNA. Losing weight intentionally through diet and exercise. Denies adenopathy. She has menorrhagia, currently menstruating, with heavy bleeding x3 days then lighter up to 6 days. Takes a multivitamin. Had a colonoscopy in the past for anal discharge, work up was unremarkable but is still occurring.     MEDICAL HISTORY:  Past Medical History:  Diagnosis Date   No pertinent past medical history     SURGICAL HISTORY: Past Surgical History:  Procedure Laterality Date   COLONOSCOPY  2023   At [facility]   DILATION AND CURETTAGE OF UTERUS      SOCIAL HISTORY: Social History   Socioeconomic History   Marital status: Single    Spouse name: Not on file   Number of children: Not on  file   Years of education: Not on file   Highest education level: Not on file  Occupational History   Not on file  Tobacco Use   Smoking status: Never   Smokeless tobacco: Never  Vaping Use   Vaping status: Never Used  Substance and Sexual Activity   Alcohol use: Yes    Comment: occasional socially   Drug use: No   Sexual activity: Yes    Birth control/protection: None  Other Topics Concern   Not on file  Social History Narrative   Lives with son   Right Handed   Drinks caffeine rarely   Social Determinants of Health   Financial Resource Strain: Not on file  Food Insecurity: Not on file  Transportation Needs: Not on file  Physical Activity: Not on file  Stress: Not on file  Social Connections: Unknown (12/18/2021)   Received from [facility]   Social Network    Social Network: Not on file  Intimate Partner Violence: Unknown (11/17/2021)   Received from [facility], [facility]   HITS    Physically Hurt: Not on file    Insult or Talk Down To: Not on file    Threaten Physical Harm: Not on file    Scream or Curse: Not on file    FAMILY HISTORY: Family History  Problem Relation Age of Onset   Healthy Mother    Hypertension Father     ALLERGIES:  has No Known Allergies.  MEDICATIONS:  Current Outpatient Medications  Medication Sig  Dispense Refill   baclofen (LIORESAL) 20 MG tablet Take 1 tablet (20 mg total) by mouth 3 (three) times daily. For menstrual cramping 30 each 2   ibuprofen (ADVIL) 800 MG tablet Take 1 tablet (800 mg total) by mouth every 8 (eight) hours as needed. 30 tablet 0   Multiple Vitamin (MULTIVITAMIN) tablet Take 1 tablet by mouth daily.     Norethindrone Acetate-Ethinyl Estradiol (LOESTRIN 1.5/30, 21,) 1.5-30 MG-MCG tablet Take 1 tablet by mouth daily. SKIP PLACEBO PILLS TO PREVENT CYCLE. 84 tablet 3   Vitamin D, Ergocalciferol, (DRISDOL) 1.25 MG (50000 UNIT) CAPS capsule Take 1 capsule (50,000 Units total) by mouth every 7  (seven) days. 12 capsule 3   No current facility-administered medications for this visit.    REVIEW OF SYSTEMS:   Constitutional: Denies fevers, chills, abnormal night sweats, or unintentional weight loss Eyes: Denies blurriness of vision, double vision (+) watery eyes/seasonal allergies Ears, nose, mouth, throat, and face: Denies mucositis or sore throat Respiratory: Denies cough, dyspnea or wheezes Cardiovascular: Denies palpitation, chest discomfort or lower extremity swelling Gastrointestinal:  Denies nausea, heartburn or change in bowel habits (+) anal discharge Skin: Denies abnormal skin rashes Lymphatics: Denies new lymphadenopathy or easy bruising Neurological:Denies numbness, tingling or new weaknesses Behavioral/Psych: Mood is stable, no new changes  All other systems were reviewed with the patient and are negative.  PHYSICAL EXAMINATION: ECOG PERFORMANCE STATUS: 0 - Asymptomatic  Vitals:   04/30/23 1257  BP: 102/69  Pulse: 79  Resp: 17  Temp: 98.3 F (36.8 C)  SpO2: 100%   Filed Weights   04/30/23 1257  Weight: 231 lb (104.8 kg)    GENERAL:alert, no distress and comfortable SKIN:no rashes or significant lesions EYES: sclera clear NECK: without mass LYMPH:  no palpable cervical, supraclavicular, or axillary lymphadenopathy LUNGS: clear with normal breathing effort HEART: regular rate & rhythm, no lower extremity edema ABDOMEN:abdomen soft, non-tender and normal bowel sounds Musculoskeletal:no cyanosis of digits and no clubbing  PSYCH: alert & oriented x 3 with fluent speech NEURO: no focal motor/sensory deficits  LABORATORY DATA:  I have reviewed the data as listed    Latest Ref Rng & Units 03/23/2023   11:10 AM 02/08/2023    3:53 PM 07/01/2022   12:22 PM  CBC  WBC 3.4 - 10.8 x10E3/uL 13.0  12.3  10.9   Hemoglobin 11.1 - 15.9 g/dL 78.2  95.6  21.3   Hematocrit 34.0 - 46.6 % 39.0  36.4  34.7   Platelets 150 - 450 x10E3/uL 318  337  301        Latest  Ref Rng & Units 02/08/2023    3:53 PM 06/06/2021    5:41 AM 02/23/2021    1:07 AM  CMP  Glucose 70 - 99 mg/dL 91  086  578   BUN 6 - 20 mg/dL 7  9  12    Creatinine 0.57 - 1.00 mg/dL 4.69  6.29  5.28   Sodium 134 - 144 mmol/L 139  136  137   Potassium 3.5 - 5.2 mmol/L 4.3  4.0  3.7   Chloride 96 - 106 mmol/L 102  104  106   CO2 20 - 29 mmol/L 23  21    Calcium 8.7 - 10.2 mg/dL 9.4  8.9    Total Protein 6.0 - 8.5 g/dL 7.2     Total Bilirubin 0.0 - 1.2 mg/dL <4.1     Alkaline Phos 44 - 121 IU/L 84     AST 0 -  40 IU/L 12     ALT 0 - 32 IU/L 11        RADIOGRAPHIC STUDIES: I have personally reviewed the radiological images as listed and agreed with the findings in the report. No results found.  ASSESSMENT & PLAN: 32 yo female with seasonal allergies and menorrhagia   Leukocytosis -We reviewed her medical record in detail with the patient, she has mild intermittent leukocytosis, with periodic lymphocytosis and neutrophilia at times since at least 2008. No other cytopenias -No h/o smoking, steroid use, asplenia -She has seasonal allergies which we discussed can create a statue of chronic inflammation, and recurrent respiratory infection for past 3 years, however, leukocytosis predates these infections -Due to her young age, benign exam, and mild/intermittent nature of leukocytosis, the suspicion for MPN or chronic leukemia is very low -Pt seen with Dr. Mosetta Putt who feels this is most likely benign and reactive, and given the mild degree, does not recommend further work up at this time.  -Continue PCP monitoring -We can see her back in the future if she has worsening leukocytosis or other blood count discrepancies   PLAN: -Medical record reviewed -Mild intermittent leukocytosis is likely benign, reactive -No further work up recommended at this time, continue PCP management -F/up open, if needed in the future for worsening leukocytosis or other blood count discrepancies  -Pt seen with Dr.  Mosetta Putt      All questions were answered. The patient knows to call the clinic with any problems, questions or concerns.     Pollyann Samples, NP 04/30/23   Addendum I have seen the patient, examined her. I agree with the assessment and and plan and have edited the notes.   32 yo female with PMH of seasonal allergy, who was referred for leukocytosis, with predominant neutrophilia.  She has had mildly elevated WBC, intermittent, with several normal WBC, since 2008.  Her hemoglobin and panda count has been normal.  She is asymptomatic.  Given the intermittent nature and a long stable course, this is likely reactive and benign, probably secondary to allergy.  I do not have clinical suspicion for CML or other primary bone marrow disease.  I recommended her  monitoring her CBC with differential 1-2 times a year, and to call us back if she has significantly worsening neutrophilia, or if she develops other abnormal blood counts.  She voiced good understanding and agrees with the plan.  Will see her as needed in future.  Malachy Mood MD 04/30/2023

## 2023-09-11 ENCOUNTER — Encounter: Payer: Self-pay | Admitting: Emergency Medicine

## 2023-09-11 ENCOUNTER — Ambulatory Visit (INDEPENDENT_AMBULATORY_CARE_PROVIDER_SITE_OTHER): Payer: Medicaid Other

## 2023-09-11 ENCOUNTER — Ambulatory Visit
Admission: EM | Admit: 2023-09-11 | Discharge: 2023-09-11 | Disposition: A | Discharge status: Home / Self Care | Payer: Medicaid Other | Attending: Physician Assistant | Admitting: Physician Assistant

## 2023-09-11 ENCOUNTER — Other Ambulatory Visit: Payer: Self-pay

## 2023-09-11 DIAGNOSIS — R051 Acute cough: Secondary | ICD-10-CM | POA: Diagnosis not present

## 2023-09-11 DIAGNOSIS — J40 Bronchitis, not specified as acute or chronic: Secondary | ICD-10-CM | POA: Diagnosis not present

## 2023-09-11 DIAGNOSIS — J329 Chronic sinusitis, unspecified: Secondary | ICD-10-CM | POA: Diagnosis not present

## 2023-09-11 LAB — POC COVID19/FLU A&B COMBO
Covid Antigen, POC: NEGATIVE
Influenza A Antigen, POC: NEGATIVE
Influenza B Antigen, POC: NEGATIVE

## 2023-09-11 MED ORDER — AMOXICILLIN-POT CLAVULANATE 875-125 MG PO TABS: 1.0000 | ORAL_TABLET | Freq: Two times a day (BID) | ORAL | 0 refills | Status: DC

## 2023-09-11 MED ORDER — PREDNISONE 20 MG PO TABS
40.0000 mg | ORAL_TABLET | Freq: Every day | ORAL | 0 refills | Status: AC
Start: 2023-09-11 — End: 2023-09-16

## 2023-09-11 NOTE — ED Provider Notes (Signed)
[facility]    CSN: [id number] Arrival date & time: 09/11/23  1255      History   Chief Complaint Chief Complaint  Patient presents with   Cough    HPI Pamela Buchanan is a 33 y.o. female.   Here today for evaluation of cough congestion that started 2 days ago.  She has also had some nausea and vomiting.  She is concerned for possible pneumonia as she has had same in the past.  The history is provided by the patient.  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 Authorizing Provider  amoxicillin-clavulanate (AUGMENTIN) 875-125 MG tablet Take 1 tablet by mouth every 12 (twelve) hours. 09/11/23  Yes Tomi Bamberger, PA-C  predniSONE (DELTASONE) 20 MG tablet Take 2 tablets (40 mg total) by mouth daily with breakfast for 5 days. 09/11/23 09/16/23 Yes Tomi Bamberger, PA-C  baclofen (LIORESAL) 20 MG tablet Take 1 tablet (20 mg total) by mouth 3 (three) times daily. For menstrual cramping 02/08/23   Early, Sung Amabile, NP  ibuprofen (ADVIL) 800 MG tablet Take 1  tablet (800 mg total) by mouth every 8 (eight) hours as needed. 02/08/23   Tollie Eth, NP  Multiple Vitamin (MULTIVITAMIN) tablet Take 1 tablet by mouth daily.    [provider]  Norethindrone Acetate-Ethinyl Estradiol (LOESTRIN 1.5/30, 21,) 1.5-30 MG-MCG tablet Take 1 tablet by mouth daily. SKIP PLACEBO PILLS TO PREVENT CYCLE. 02/08/23   Early, Sung Amabile, NP  Vitamin D, Ergocalciferol, (DRISDOL) 1.25 MG (50000 UNIT) CAPS capsule Take 1 capsule (50,000 Units total) by mouth every 7 (seven) days. 02/14/23   Tollie Eth, NP  diphenhydrAMINE (BENADRYL) 25 MG tablet Take 1 tablet (25 mg total) by mouth every 6 (six) hours. 06/20/15 07/31/19  Emilia Beck, PA-C    Family History Family History  Problem Relation Age of Onset   Healthy Mother    Hypertension Father     Social History Social History   Tobacco Use   Smoking status: Never   Smokeless tobacco: Never  Vaping Use   Vaping status: Never Used  Substance Use Topics   Alcohol use: Yes    Comment: occasional socially   Drug use: No  Allergies   Patient has no known allergies.   Review of Systems Review of Systems  Constitutional:  Negative for chills and fever.  HENT:  Positive for congestion, sinus pressure and sore throat. Negative for ear pain.   Eyes:  Negative for discharge and redness.  Respiratory:  Positive for cough. Negative for shortness of breath and wheezing.   Gastrointestinal:  Negative for abdominal pain, diarrhea, nausea and vomiting.     Physical Exam Triage Vital Signs ED Triage Vitals  Encounter Vitals Group     BP 09/11/23 1431 114/75     Systolic BP Percentile --      Diastolic BP Percentile --      Pulse Rate 09/11/23 1431 81     Resp 09/11/23 1431 18     Temp 09/11/23 1431 98.2 F (36.8 C)     Temp Source 09/11/23 1431 Oral     SpO2 09/11/23 1431 97 %     Weight --      Height --      Head Circumference --      Peak Flow --      Pain Score 09/11/23 1432 3     Pain Loc  --      Pain Education --      Exclude from Growth Chart --    No data found.  Updated Vital Signs BP 114/75 (BP Location: Left Arm)   Pulse 81   Temp 98.2 F (36.8 C) (Oral)   Resp 18   SpO2 97%   Visual Acuity Right Eye Distance:   Left Eye Distance:   Bilateral Distance:    Right Eye Near:   Left Eye Near:    Bilateral Near:     Physical Exam Vitals and nursing note reviewed.  Constitutional:      General: She is not in acute distress.    Appearance: Normal appearance. She is not ill-appearing.  HENT:     Head: Normocephalic and atraumatic.     Right Ear: Tympanic membrane normal.     Left Ear: Tympanic membrane normal.     Nose: Congestion present.     Mouth/Throat:     Mouth: Mucous membranes are moist.     Pharynx: No oropharyngeal exudate or posterior oropharyngeal erythema.  Eyes:     Conjunctiva/sclera: Conjunctivae normal.  Cardiovascular:     Rate and Rhythm: Normal rate and regular rhythm.     Heart sounds: Normal heart sounds. No murmur heard. Pulmonary:     Effort: Pulmonary effort is normal. No respiratory distress.     Breath sounds: Normal breath sounds. No wheezing, rhonchi or rales.  Skin:    General: Skin is warm and dry.  Neurological:     Mental Status: She is alert.  Psychiatric:        Mood and Affect: Mood normal.        Thought Content: Thought content normal.      UC Treatments / Results  Labs (all labs ordered are listed, but only abnormal results are displayed) Labs Reviewed  POC COVID19/FLU A&B COMBO - Normal    EKG   Radiology DG Chest 2 View Result Date: 09/11/2023 CLINICAL DATA:  Cough. EXAM: CHEST - 2 VIEW COMPARISON:  Radiographs 06/06/2021 and 02/05/2018. FINDINGS: Suboptimal inspiration on the lateral view. The heart size and mediastinal contours are stable. Possible mild central airway thickening. No airspace disease, edema, pleural effusion or pneumothorax. The bones appear unremarkable. IMPRESSION: Possible  mild central airway thickening which  could reflect bronchitis or reactive airways disease. No evidence of pneumonia. Electronically Signed   By: Carey Bullocks M.D.   On: 09/11/2023 15:10    Procedures Procedures (including critical care time)  Medications Ordered in UC Medications - No data to display  Initial Impression / Assessment and Plan / UC Course  I have reviewed the triage vital signs and the nursing notes.  Pertinent labs & imaging results that were available during my care of the patient were reviewed by me and considered in my medical decision making (see chart for details).    COVID and flu negative.  Chest x-ray with findings consistent with bronchitis.  Discussed same with patient.  Will treat with Augmentin as well as prednisone to cover both sinusitis/possible impending pneumonia versus bronchitis.  Advised follow-up if no gradual improvement with any further concerns.  Final Clinical Impressions(s) / UC Diagnoses   Final diagnoses:  Acute cough  Sinobronchitis   Discharge Instructions   None    ED Prescriptions     Medication Sig Dispense Auth. Provider   predniSONE (DELTASONE) 20 MG tablet Take 2 tablets (40 mg total) by mouth daily with breakfast for 5 days. 10 tablet Tomi Bamberger, PA-C   amoxicillin-clavulanate (AUGMENTIN) 875-125 MG tablet Take 1 tablet by mouth every 12 (twelve) hours. 14 tablet Tomi Bamberger, PA-C      PDMP not reviewed this encounter.   Tomi Bamberger, PA-C 09/11/23 1815

## 2023-09-11 NOTE — ED Triage Notes (Signed)
Pt here for N/V starting Sunday improved yesterday with some body aches and cough; pt sts some chest congestion with hx of PNA and is unsure if same

## 2024-01-10 ENCOUNTER — Encounter: Payer: Self-pay | Admitting: Nurse Practitioner

## 2024-01-10 ENCOUNTER — Ambulatory Visit: Admitting: Nurse Practitioner

## 2024-01-10 VITALS — BP 124/80 | HR 83 | Wt 239.8 lb

## 2024-01-10 DIAGNOSIS — D72820 Lymphocytosis (symptomatic): Secondary | ICD-10-CM | POA: Diagnosis not present

## 2024-01-10 DIAGNOSIS — Z1329 Encounter for screening for other suspected endocrine disorder: Secondary | ICD-10-CM

## 2024-01-10 DIAGNOSIS — Z13228 Encounter for screening for other metabolic disorders: Secondary | ICD-10-CM

## 2024-01-10 DIAGNOSIS — Z139 Encounter for screening, unspecified: Secondary | ICD-10-CM | POA: Diagnosis not present

## 2024-01-10 DIAGNOSIS — N946 Dysmenorrhea, unspecified: Secondary | ICD-10-CM

## 2024-01-10 DIAGNOSIS — E559 Vitamin D deficiency, unspecified: Secondary | ICD-10-CM

## 2024-01-10 DIAGNOSIS — Z1321 Encounter for screening for nutritional disorder: Secondary | ICD-10-CM

## 2024-01-10 DIAGNOSIS — Z130 Encounter for screening for diseases of the blood and blood-forming organs and certain disorders involving the immune mechanism: Secondary | ICD-10-CM

## 2024-01-10 DIAGNOSIS — Z113 Encounter for screening for infections with a predominantly sexual mode of transmission: Secondary | ICD-10-CM

## 2024-01-10 DIAGNOSIS — D509 Iron deficiency anemia, unspecified: Secondary | ICD-10-CM

## 2024-01-11 ENCOUNTER — Ambulatory Visit: Payer: Self-pay | Admitting: Nurse Practitioner

## 2024-01-11 LAB — GC/CHLAMYDIA PROBE AMP
Chlamydia trachomatis, NAA: NEGATIVE
Neisseria Gonorrhoeae by PCR: NEGATIVE

## 2024-01-13 LAB — VARICELLA ZOSTER ANTIBODY, IGG: Varicella zoster IgG: REACTIVE

## 2024-01-13 LAB — COMPREHENSIVE METABOLIC PANEL WITH GFR
ALT: 14 IU/L (ref 0–32)
AST: 15 IU/L (ref 0–40)
Albumin: 4.4 g/dL (ref 3.9–4.9)
Alkaline Phosphatase: 70 IU/L (ref 44–121)
BUN/Creatinine Ratio: 17 (ref 9–23)
BUN: 12 mg/dL (ref 6–20)
Bilirubin Total: <0.2 mg/dL (ref 0.0–1.2)
CO2: 20 mmol/L (ref 20–29)
Calcium: 9.1 mg/dL (ref 8.7–10.2)
Chloride: 102 mmol/L (ref 96–106)
Creatinine, Ser: 0.71 mg/dL (ref 0.57–1.00)
Globulin, Total: 3 g/dL (ref 1.5–4.5)
Glucose: 74 mg/dL (ref 70–99)
Potassium: 4.2 mmol/L (ref 3.5–5.2)
Sodium: 139 mmol/L (ref 134–144)
Total Protein: 7.4 g/dL (ref 6.0–8.5)
eGFR: 114 mL/min/{1.73_m2} (ref >59)

## 2024-01-13 LAB — CBC WITH DIFFERENTIAL/PLATELET
Basophils Absolute: 0 10*3/uL (ref 0.0–0.2)
Basos: 0 %
EOS (ABSOLUTE): 0.1 10*3/uL (ref 0.0–0.4)
Eos: 1 %
Hematocrit: 40.8 % (ref 34.0–46.6)
Hemoglobin: 12.5 g/dL (ref 11.1–15.9)
Immature Grans (Abs): 0 10*3/uL (ref 0.0–0.1)
Immature Granulocytes: 0 %
Lymphocytes Absolute: 3.1 10*3/uL (ref 0.7–3.1)
Lymphs: 32 %
MCH: 27.7 pg (ref 26.6–33.0)
MCHC: 30.6 g/dL — ABNORMAL LOW (ref 31.5–35.7)
MCV: 91 fL (ref 79–97)
Monocytes Absolute: 0.6 10*3/uL (ref 0.1–0.9)
Monocytes: 7 %
Neutrophils Absolute: 5.8 10*3/uL (ref 1.4–7.0)
Neutrophils: 60 %
Platelets: 366 10*3/uL (ref 150–450)
RBC: 4.51 x10E6/uL (ref 3.77–5.28)
RDW: 12.3 % (ref 11.7–15.4)
WBC: 9.7 10*3/uL (ref 3.4–10.8)

## 2024-01-13 LAB — QUANTIFERON-TB GOLD PLUS
QuantiFERON Mitogen Value: >10 [IU]/mL
QuantiFERON Nil Value: 0.01 [IU]/mL
QuantiFERON TB1 Ag Value: 0.03 [IU]/mL
QuantiFERON TB2 Ag Value: 0.02 [IU]/mL
QuantiFERON-TB Gold Plus: NEGATIVE

## 2024-01-13 LAB — MEASLES/MUMPS/RUBELLA IMMUNITY
MUMPS ABS, IGG: >300 [AU]/ml (ref >10.9)
RUBEOLA AB, IGG: 132 [AU]/ml (ref >16.4)
Rubella Antibodies, IGG: 5.31 {index} (ref >0.99)

## 2024-01-13 LAB — VITAMIN D 25 HYDROXY (VIT D DEFICIENCY, FRACTURES): Vit D, 25-Hydroxy: 24.6 ng/mL — ABNORMAL LOW (ref 30.0–100.0)

## 2024-01-13 LAB — IRON,TIBC AND FERRITIN PANEL
Ferritin: 152 ng/mL — ABNORMAL HIGH (ref 15–150)
Iron Saturation: 22 % (ref 15–55)
Iron: 81 ug/dL (ref 27–159)
Total Iron Binding Capacity: 369 ug/dL (ref 250–450)
UIBC: 288 ug/dL (ref 131–425)

## 2024-01-14 ENCOUNTER — Other Ambulatory Visit: Payer: Self-pay | Admitting: Nurse Practitioner

## 2024-01-14 DIAGNOSIS — E559 Vitamin D deficiency, unspecified: Secondary | ICD-10-CM

## 2024-01-14 DIAGNOSIS — D509 Iron deficiency anemia, unspecified: Secondary | ICD-10-CM | POA: Insufficient documentation

## 2024-01-14 DIAGNOSIS — N946 Dysmenorrhea, unspecified: Secondary | ICD-10-CM

## 2024-01-14 DIAGNOSIS — Z0000 Encounter for general adult medical examination without abnormal findings: Secondary | ICD-10-CM

## 2024-01-14 MED ORDER — BACLOFEN 20 MG PO TABS: 20.0000 mg | ORAL_TABLET | Freq: Three times a day (TID) | ORAL | 2 refills | Status: AC

## 2024-01-14 MED ORDER — IBUPROFEN 800 MG PO TABS: 800.0000 mg | ORAL_TABLET | Freq: Three times a day (TID) | ORAL | 0 refills | Status: AC | PRN

## 2024-01-14 MED ORDER — NORETHINDRONE ACET-ETHINYL EST 1.5-30 MG-MCG PO TABS: 1.0000 | ORAL_TABLET | Freq: Every day | ORAL | 3 refills | Status: AC

## 2024-01-14 MED ORDER — VITAMIN D (ERGOCALCIFEROL) 1.25 MG (50000 UNIT) PO CAPS: 50000.0000 [IU] | ORAL_CAPSULE | ORAL | 3 refills | Status: AC

## 2024-01-14 NOTE — Assessment & Plan Note (Signed)
 Reports taking a new multivitamin with iron, experiencing changes in bowel movements. No current symptoms indicating deficiency. Uncertain if iron intake is within normal levels.

## 2024-01-14 NOTE — Progress Notes (Signed)
 Annella Kief, DNP, AGNP-c [facility] [address] [place], [place] [address] [phone]   ACUTE VISIT- ESTABLISHED PATIENT  Blood pressure 124/80, pulse 83, weight 239 lb 12.8 oz (108.8 kg), unknown if currently breastfeeding.  Subjective:  HPI History of Present Illness Pamela Buchanan is a 33 year old female who presents for blood work and prescription refills.  She is here for blood work, including MMR and varicella titers, and a TB gold test. She is not planning to get any more boosters, and the nursing director approved the TB gold test.  She requests a prescription refill for birth control and medication for menstrual cramps. She is uncertain about her vitamin D  levels and has started a new multivitamin, taking two tablets twice a day. She notes a change in her bowel movements since starting the multivitamin, attributing it to the iron content. She feels better after taking the prescribed vitamin D .  She mentions a previous referral to an oncologist at the cancer center, where her blood work was not concerning, and no follow-up was needed.  She requests STI testing, including chlamydia and HIV, despite not being sexually active recently, as she wants to ensure her health status is clear.  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 She has a one-year-old child and a thirteen-year-old son. 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  No changes in her health since last year, except for changes in bowel movements due to a new multivitamin. No gastrointestinal symptoms other than those related to the multivitamin. No current GI symptoms or concerns about her eyes and ears.  ROS negative except for what is listed in HPI. History, Medications, Surgery, SDOH, and Family History reviewed and updated as appropriate.  Objective:  Physical Exam Vitals  and nursing note reviewed.  Constitutional:      Appearance: Normal appearance.  HENT:     Head: Normocephalic.  Eyes:     Conjunctiva/sclera: Conjunctivae normal.  Cardiovascular:     Rate and Rhythm: Normal rate and regular rhythm.     Pulses: Normal pulses.     Heart sounds: Normal heart sounds.  Pulmonary:     Effort: Pulmonary effort is normal.     Breath sounds: Normal breath sounds.  Abdominal:     General: Bowel sounds are normal. There is no distension.     Palpations: Abdomen is soft.     Tenderness: There is no abdominal tenderness. There is no guarding.  Skin:    General: Skin is warm and dry.     Capillary Refill: Capillary refill takes less than 2 seconds.  Neurological:     Mental Status: She is alert and oriented to person, place, and time.  Psychiatric:        Mood and Affect: Mood normal.        Behavior: Behavior normal.         Assessment & Plan:   Problem List Items Addressed This Visit     Vitamin D  deficiency   Reports improvement with once-weekly vitamin D  supplementation. No current symptoms indicating deficiency. Considering checking vitamin D  levels to confirm status. - Continue current vitamin D  supplementation regimen. - Check vitamin D  levels with blood draw.       Relevant Orders   Comprehensive metabolic panel with GFR (Completed)   VITAMIN D  25 Hydroxy (Vit-D Deficiency, Fractures) (Completed)   Lymphocytosis   Lab  repeated today      Relevant Orders   CBC with Differential/Platelet (Completed)   Comprehensive metabolic panel with GFR (Completed)   Routine screening for STI (sexually transmitted infection)   Requests STI screening despite low sexual activity. No current symptoms or concerns reported. Agreed to add HIV screening to blood work. - Perform urine test for chlamydia and gonorrhea.       Relevant Orders   GC/Chlamydia Probe Amp (Completed)   Severe dysmenorrhea   Requests refill of prescription for birth control to  manage menstrual cramps. - Prescribe birth control for cramp management. No missed doses       Iron deficiency anemia   Reports taking a new multivitamin with iron, experiencing changes in bowel movements. No current symptoms indicating deficiency. Uncertain if iron intake is within normal levels.      Relevant Orders   CBC with Differential/Platelet (Completed)   Iron, TIBC and Ferritin Panel (Completed)   Other Visit Diagnoses       Encounter for health-related screening    -  Primary   Relevant Orders   QuantiFERON-TB Gold Plus (Completed)   Measles/Mumps/Rubella Immunity (Completed)   Varicella zoster antibody, IgG (Completed)     Screening for endocrine, nutritional, metabolic and immunity disorder       Relevant Orders   CBC with Differential/Platelet (Completed)   Comprehensive metabolic panel with GFR (Completed)   VITAMIN D  25 Hydroxy (Vit-D Deficiency, Fractures) (Completed)   Iron, TIBC and Ferritin Panel (Completed)         Annella Kief, DNP, AGNP-c

## 2024-01-14 NOTE — Telephone Encounter (Signed)
 Last apt 01/10/24.

## 2024-01-14 NOTE — Telephone Encounter (Signed)
 Last Fill: Vitamin D : 02/14/23     Loestrin: 02/08/23     Ibuprofen : 02/08/23     Baclofen : 02/08/23  Last OV: 02/08/23 Next OV: 02/14/24 CPE  Routing to provider for review/authorization.

## 2024-01-14 NOTE — Assessment & Plan Note (Signed)
 Lab repeated today

## 2024-01-14 NOTE — Assessment & Plan Note (Signed)
 Requests STI screening despite low sexual activity. No current symptoms or concerns reported. Agreed to add HIV screening to blood work. - Perform urine test for chlamydia and gonorrhea.

## 2024-01-14 NOTE — Assessment & Plan Note (Signed)
 Reports improvement with once-weekly vitamin D  supplementation. No current symptoms indicating deficiency. Considering checking vitamin D  levels to confirm status. - Continue current vitamin D  supplementation regimen. - Check vitamin D  levels with blood draw.

## 2024-01-14 NOTE — Assessment & Plan Note (Signed)
 Requests refill of prescription for birth control to manage menstrual cramps. - Prescribe birth control for cramp management. No missed doses

## 2024-01-14 NOTE — Telephone Encounter (Signed)
 Copied from CRM [id number]. Topic: Clinical - Medication Refill >> Jan 14, 2024  9:06 AM Allyne Areola wrote: Medication: Vitamin D , Ergocalciferol , (DRISDOL ) 1.25 MG (50000 UNIT) CAPS capsule , Norethindrone  Acetate-Ethinyl Estradiol (LOESTRIN 1.5/30, 21,) 1.5-30 MG-MCG tablet , ibuprofen  (ADVIL ) 800 MG tablet, baclofen  (LIORESAL ) 20 MG tablet  Has the patient contacted their pharmacy? No (Agent: If no, request that the patient contact the pharmacy for the refill. If patient does not wish to contact the pharmacy document the reason why and proceed with request.) (Agent: If yes, when and what did the pharmacy advise?)  This is the patient's preferred pharmacy:  [id number] - [place], [place] - [address] AT [facility] OF [address]  [place] [address] Phone: [phone] Fax: [phone]   Is this the correct pharmacy for this prescription? Yes If no, delete pharmacy and type the correct one.   Has the prescription been filled recently? No  Is the patient out of the medication? Yes  Has the patient been seen for an appointment in the last year OR does the patient have an upcoming appointment? Yes  Can we respond through MyChart? Yes  Agent: Please be advised that Rx refills may take up to 3 business days. We ask that you follow-up with your pharmacy.

## 2024-02-14 ENCOUNTER — Encounter: Payer: BC Managed Care – PPO | Admitting: Nurse Practitioner

## 2024-06-10 ENCOUNTER — Emergency Department
Admission: EM | Admit: 2024-06-10 | Discharge: 2024-06-10 | Disposition: A | Discharge status: Home / Self Care | Attending: Emergency Medicine | Admitting: Emergency Medicine

## 2024-06-10 ENCOUNTER — Emergency Department

## 2024-06-10 DIAGNOSIS — R0789 Other chest pain: Secondary | ICD-10-CM | POA: Insufficient documentation

## 2024-06-10 DIAGNOSIS — M7989 Other specified soft tissue disorders: Secondary | ICD-10-CM

## 2024-06-10 DIAGNOSIS — R0602 Shortness of breath: Secondary | ICD-10-CM | POA: Diagnosis not present

## 2024-06-10 DIAGNOSIS — R0600 Dyspnea, unspecified: Secondary | ICD-10-CM | POA: Insufficient documentation

## 2024-06-10 DIAGNOSIS — L0390 Cellulitis, unspecified: Secondary | ICD-10-CM

## 2024-06-10 DIAGNOSIS — L03115 Cellulitis of right lower limb: Secondary | ICD-10-CM | POA: Insufficient documentation

## 2024-06-10 DIAGNOSIS — R600 Localized edema: Secondary | ICD-10-CM | POA: Diagnosis not present

## 2024-06-10 DIAGNOSIS — M79605 Pain in left leg: Secondary | ICD-10-CM | POA: Diagnosis not present

## 2024-06-10 LAB — CBC WITH DIFFERENTIAL/PLATELET
Abs Immature Granulocytes: 0.06 K/uL (ref 0.00–0.07)
Basophils Absolute: 0 K/uL (ref 0.0–0.1)
Basophils Relative: 0 %
Eosinophils Absolute: 0.1 K/uL (ref 0.0–0.5)
Eosinophils Relative: 1 %
HCT: 38.4 % (ref 36.0–46.0)
Hemoglobin: 12.3 g/dL (ref 12.0–15.0)
Immature Granulocytes: 1 %
Lymphocytes Relative: 29 %
Lymphs Abs: 3.4 K/uL (ref 0.7–4.0)
MCH: 28.8 pg (ref 26.0–34.0)
MCHC: 32 g/dL (ref 30.0–36.0)
MCV: 89.9 fL (ref 80.0–100.0)
Monocytes Absolute: 0.6 K/uL (ref 0.1–1.0)
Monocytes Relative: 5 %
Neutro Abs: 7.6 K/uL (ref 1.7–7.7)
Neutrophils Relative %: 64 %
Platelets: 337 K/uL (ref 150–400)
RBC: 4.27 MIL/uL (ref 3.87–5.11)
RDW: 12.9 % (ref 11.5–15.5)
WBC: 11.7 K/uL — ABNORMAL HIGH (ref 4.0–10.5)
nRBC: 0 % (ref 0.0–0.2)

## 2024-06-10 LAB — BRAIN NATRIURETIC PEPTIDE: B Natriuretic Peptide: 19.5 pg/mL (ref 0.0–100.0)

## 2024-06-10 LAB — COMPREHENSIVE METABOLIC PANEL WITH GFR
ALT: 13 U/L (ref 0–44)
AST: 20 U/L (ref 15–41)
Albumin: 3.6 g/dL (ref 3.5–5.0)
Alkaline Phosphatase: 75 U/L (ref 38–126)
Anion gap: 8 (ref 5–15)
BUN: 8 mg/dL (ref 6–20)
CO2: 26 mmol/L (ref 22–32)
Calcium: 8.7 mg/dL — ABNORMAL LOW (ref 8.9–10.3)
Chloride: 103 mmol/L (ref 98–111)
Creatinine, Ser: 0.53 mg/dL (ref 0.44–1.00)
GFR, Estimated: >60 mL/min (ref >60)
Glucose, Bld: 104 mg/dL — ABNORMAL HIGH (ref 70–99)
Potassium: 3.9 mmol/L (ref 3.5–5.1)
Sodium: 137 mmol/L (ref 135–145)
Total Bilirubin: 0.4 mg/dL (ref 0.0–1.2)
Total Protein: 8.3 g/dL — ABNORMAL HIGH (ref 6.5–8.1)

## 2024-06-10 LAB — D-DIMER, QUANTITATIVE: D-Dimer, Quant: 0.33 ug{FEU}/mL (ref 0.00–0.50)

## 2024-06-10 LAB — TROPONIN I (HIGH SENSITIVITY): Troponin I (High Sensitivity): <2 ng/L (ref <18)

## 2024-06-10 LAB — HCG, QUANTITATIVE, PREGNANCY: hCG, Beta Chain, Quant, S: 1 m[IU]/mL (ref <5)

## 2024-06-10 MED ORDER — CEPHALEXIN 500 MG PO CAPS
500.0000 mg | ORAL_CAPSULE | Freq: Once | ORAL | Status: AC
  Administered 2024-06-10: 500 mg via ORAL
  Filled 2024-06-10: qty 1

## 2024-06-10 MED ORDER — CEPHALEXIN 500 MG PO CAPS: 500.0000 mg | ORAL_CAPSULE | Freq: Four times a day (QID) | ORAL | 0 refills | Status: AC

## 2024-06-10 NOTE — Discharge Instructions (Signed)
 Please take the antibiotics as prescribed for your cellulitis.  Please make sure to follow-up with your primary care doctor this week or early next week to get reassessed.

## 2024-06-10 NOTE — ED Triage Notes (Signed)
 Pt presents to the ED via pov from home. Pt reports bilateral leg pain and swelling. Pt also reports SHOB at night when she is lying down. Pt A&Ox4. Ambulatory without difficulty to exam room.

## 2024-06-10 NOTE — ED Provider Notes (Signed)
 [facility] Provider Note    Event Date/Time   First MD Initiated Contact with Patient 06/10/24 [phone]     (approximate)   History   Shortness of Breath   HPI  Pamela Buchanan is a 33 y.o. female with history of iron deficiency anemia, presenting with a month of lower leg swelling.  Also notes orthopnea although she does not note shortness of breath during the day.  States that she notes intermittent chest pressure.  No prior history of DVTs or cardiac issues, no recent travel or surgeries.  States that she used to be on birth control but stopped a month and a half ago.  No history of malignancies.  No cough or fever.  States that she noticed a rash to her left shin several weeks ago, states that it was pruritic, she put Neosporin and hydrogen peroxide on it with improvement to the rash.  Now notes that she has a rash on her right shin that is pruritic, no purulent drainage.  No history of asthma or COPD.  On independent chart review, she was seen by primary care in May, is on birth control medications for her period cramps.  Is on multivitamin with iron for iron deficiency anemia.     Physical Exam   Triage Vital Signs: ED Triage Vitals  Encounter Vitals Group     BP      Girls Systolic BP Percentile      Girls Diastolic BP Percentile      Boys Systolic BP Percentile      Boys Diastolic BP Percentile      Pulse      Resp      Temp      Temp src      SpO2      Weight      Height      Head Circumference      Peak Flow      Pain Score      Pain Loc      Pain Education      Exclude from Growth Chart     Most recent vital signs: Vitals:   06/10/24 0903  BP: (!) 117/54  Pulse: 89  Resp: 18  Temp: 98.8 F (37.1 C)  SpO2: 100%     General: Awake, no distress.  CV:  Good peripheral perfusion.  Resp:  Normal effort.  No tachypnea or respiratory distress Abd:  No distention.  Soft nontender Other:  Trace bilateral lower extremity edema that  appears symmetrical, she does have a rash to her right shin, there is excoriations, no induration or fluctuance, is mildly erythematous   ED Results / Procedures / Treatments   Labs (all labs ordered are listed, but only abnormal results are displayed) Labs Reviewed  COMPREHENSIVE METABOLIC PANEL WITH GFR - Abnormal; Notable for the following components:      Result Value   Glucose, Bld 104 (*)    Calcium 8.7 (*)    Total Protein 8.3 (*)    All other components within normal limits  CBC WITH DIFFERENTIAL/PLATELET - Abnormal; Notable for the following components:   WBC 11.7 (*)    All other components within normal limits  D-DIMER, QUANTITATIVE  BRAIN NATRIURETIC PEPTIDE  HCG, QUANTITATIVE, PREGNANCY  TROPONIN I (HIGH SENSITIVITY)     EKG  EKG shows, sinus rhythm heart rate 79, normal QS, normal QTc, no obvious ischemic ST elevation, T wave flattening in 3, not significantly changed compared  to prior   RADIOLOGY On my independent interpretation, ultrasound without DVT   PROCEDURES:  Critical Care performed: No  Procedures   MEDICATIONS ORDERED IN ED: Medications  cephALEXin  (KEFLEX ) capsule 500 mg (has no administration in time range)     IMPRESSION / MDM / ASSESSMENT AND PLAN / ED COURSE  I reviewed the triage vital signs and the nursing notes.                              Differential diagnosis includes, but is not limited to, lymphedema, DVT, musculoskeletal pain, possible early cellulitis, eczema, for nocturnal orthopnea and intermittent chest pressure, did consider ACS, angina, OSA, CHF, did consider PE but patient is not hypoxic, not tachycardic, not tachypneic.  She was not on OCP, half ago.  Will get D-dimer, labs, EKG, troponin, chest x-ray, DVT ultrasound.  Patient's presentation is most consistent with acute presentation with potential threat to life or bodily function.  Independent interpretation of labs and imaging below.  Labs are reassuring, given  the mild erythema to the right shin, will start her on Keflex  for early cellulitis.  She will get the initial dose here and I sent a prescription to the pharmacy.  Otherwise considered but no indication for inpatient admission at this time, she safe for outpatient management.  Will discharge with instructions to follow-up with primary care doctor to get reassessed this week or early next week.  Strict return precautions given.  Shared decision making done with patient and she is agreeable with the plan.  Discharge.  The patient is on the cardiac monitor to evaluate for evidence of arrhythmia and/or significant heart rate changes.   Clinical Course as of 06/10/24 1120  Tue Jun 10, 2024  1003 DG Chest 1 View No active disease.  [TT]  1010 Independent review of labs, troponins not elevated, hCG is not elevated, electrolytes not severely deranged, LFTs are not elevated, D-dimer is not elevated, leukocytosis. [TT]  1033 B Natriuretic Peptide: 19.5 Not elevated [TT]  1118 US  Venous Img Lower Bilateral No evidence of deep venous thrombosis in either lower extremity.  [TT]    Clinical Course User Index [TT] Waymond Lorelle Cummins, MD     FINAL CLINICAL IMPRESSION(S) / ED DIAGNOSES   Final diagnoses:  Cellulitis, unspecified cellulitis site  Dyspnea, unspecified type  Leg swelling     Rx / DC Orders   ED Discharge Orders          Ordered    cephALEXin  (KEFLEX ) 500 MG capsule  4 times daily        06/10/24 1119             Note:  This document was prepared using Dragon voice recognition software and may include unintentional dictation errors.    Waymond Lorelle Cummins, MD 06/10/24 1120

## 2024-06-23 ENCOUNTER — Telehealth (INDEPENDENT_AMBULATORY_CARE_PROVIDER_SITE_OTHER): Payer: Self-pay | Admitting: Nurse Practitioner

## 2024-06-23 ENCOUNTER — Encounter: Payer: Self-pay | Admitting: Nurse Practitioner

## 2024-06-23 VITALS — Ht 61.0 in | Wt 242.0 lb

## 2024-06-23 DIAGNOSIS — Z6841 Body Mass Index (BMI) 40.0 and over, adult: Secondary | ICD-10-CM

## 2024-06-23 DIAGNOSIS — R0683 Snoring: Secondary | ICD-10-CM | POA: Diagnosis not present

## 2024-06-23 MED ORDER — PHENTERMINE HCL 37.5 MG PO CAPS: 37.5000 mg | ORAL_CAPSULE | ORAL | 1 refills | Status: DC

## 2024-06-23 NOTE — Assessment & Plan Note (Signed)
 Obesity with a BMI of 45.7. She has not been on weight loss medication and is not currently on a diet or exercise program. Previous attempts at weight loss through diet and exercise have been unsuccessful and frustrating. She is interested in pharmacological intervention to aid weight loss with a new start of diet changes and exercise daily. We discussed options and she would like to try Phentermine as a short-term option due to its affordability and potential effectiveness. Discussed the risks of phentermine, including increased blood pressure and palpitations, and the need for short-term use. Emphasized the importance of continuing diet and exercise to maintain weight loss after medication cessation. We can consider other medications if this is not helpful. We will plan to follow up in 2 months to monitor progress and determine if changes need to be made or continuation is appropriate.  - Prescribed phentermine, to be taken once daily in the morning. - Advised to start with a half tablet if needed, with the option to increase to a full tablet. - Encouraged continuation of diet and exercise as part of a lifestyle change. - Advised to monitor for side effects such as increased heart rate or headaches and to discontinue if these occur. - monitor BP at home and stop medication if blood pressure is consistently higher than 135/85.

## 2024-06-23 NOTE — Assessment & Plan Note (Signed)
 Reports increased snoring and episodes of gasping for air during sleep, suggestive of possible obstructive sleep apnea. Weight gain may be contributing to the severity of symptoms. Discussed the potential impact of sleep apnea on weight management and overall health. Consideration of a sleep study to confirm diagnosis, but decision to defer until after trial of phentermine. - Will consider ordering a home sleep study if symptoms persist or worsen after trial of phentermine.

## 2024-06-23 NOTE — Patient Instructions (Signed)
 I have sent in phentermine to help with weight management. It is very important that you use this as a tool with exercise and diet changes.  I strongly recommend dietary changes that can be kept up for life. Don't look at this as a diet, but look at this as a lifestyle change. Below are some tips that can be helpful.   WEIGHT LOSS PLANNING Your progress today shows:     06/23/2024    1:34 PM 06/10/2024   11:30 AM 06/10/2024   11:00 AM  Vitals with BMI  Height 5' 1    Weight 242 lbs    BMI 45.75    Systolic  122   Diastolic  62   Pulse  72 75    For best management of weight, it is vital to balance intake versus output. This means the number of calories burned per day must be less than the calories you take in with food and drink.   I recommend trying to follow a diet with the following: Calories: 1500-1800 calories per day Carbohydrates: 150-180 grams of carbohydrates per day  Why: Gives your body enough quick fuel for cells to maintain normal function without sending them into starvation mode.  Protein: At least 90-100 grams of protein per day- 30 grams with each meal Why: Protein takes longer and uses more energy than carbohydrates to break down for fuel. The carbohydrates in your meals serves as quick energy sources and proteins help use some of that extra quick energy to break down to produce long term energy. This helps you not feel hungry as quickly and protein breakdown burns calories.  Water: Drink AT LEAST 64 ounces of water per day  Why: Water is essential to healthy metabolism. Water helps to fill the stomach and keep you fuller longer. Water is required for healthy digestion and filtering of waste in the body.  Fat: Limit fats in your diet- when choosing fats, choose foods with lower fats content such as lean meats (chicken, fish, turkey).  Why: Increased fat intake leads to storage for later. Once you burn your carbohydrate energy, your body goes into fat and protein  breakdown mode to help you loose weight.  Cholesterol: Fats and oils that are LIQUID at room temperature are best. Choose vegetable oils (olive oil, avocado oil, nuts). Avoid fats that are SOLID at room temperature (animal fats, processed meats). Healthy fats are often found in whole grains, beans, nuts, seeds, and berries.  Why: Elevated cholesterol levels lead to build up of cholesterol on the inside of your blood vessels. This will eventually cause the blood vessels to become hard and can lead to high blood pressure and damage to your organs. When the blood flow is reduced, but the pressure is high from cholesterol buildup, parts of the cholesterol can break off and form clots that can go to the brain or heart leading to a stroke or heart attack.  Fiber: Increase amount of SOLUBLE the fiber in your diet. This helps to fill you up, lowers cholesterol, and helps with digestion. Some foods high in soluble fiber are oats, peas, beans, apples, carrots, barley, and citrus fruits.   Why: Fiber fills you up, helps remove excess cholesterol, and aids in healthy digestion which are all very important in weight management.   I recommend the following as a minimum activity routine: Purposeful walk or other physical activity at least 20 minutes every single day. This means purposefully taking a walk, jog, bike, swim, treadmill, elliptical,  dance, etc.  This activity should be ABOVE your normal daily activities, such as walking at work. Goal exercise should be at least 150 minutes a week- work your way up to this.   Heart Rate: Your maximum exercise heart rate should be 220 - Your Age in Years. When exercising, get your heart rate up, but avoid going over the maximum targeted heart rate.  60-70% of your maximum heart rate is where you tend to burn the most fat. To find this number:  220 - Age In Years= Max HR  Max HR x 0.6 (or 0.7) = Fat Burning HR The Fat Burning HR is your goal heart rate while working out  to burn the most fat.  NEVER exercise to the point your feel lightheaded, weak, nauseated, dizzy. If you experience ANY of these symptoms- STOP exercise! Allow yourself to cool down and your heart rate to come down. Then restart slower next time.  If at ANY TIME you feel chest pain or chest pressure during exercise, STOP IMMEDIATELY and seek medical attention.

## 2024-06-23 NOTE — Progress Notes (Signed)
 Virtual Visit Encounter mychart visit.   I connected with  Sal GORMAN Bring on 06/23/24 at  1:45 PM EST by secure video and audio telemedicine application. I verified that I am speaking with the correct person using two identifiers.   I introduced myself as a Publishing Rights Manager with the practice. The limitations of evaluation and management by telemedicine discussed with the patient and the availability of in person appointments. The patient expressed verbal understanding and consent to proceed.  Participating parties in this visit include: Myself and patient  The patient is: Patient Location: Other:  car I am: Provider Location: Office/Clinic Subjective:    CC and HPI:   History of Present Illness Pamela Buchanan is a 33 year old female who presents with concerns about weight management.  She is concerned about her current weight of 242 pounds and a BMI of 45.73. She has not used weight loss medication and is not following any specific diet or exercise program. Despite previous attempts at weight loss through exercise and dietary changes, she has not achieved success and is interested in exploring other resources.  She has experienced weight gain over time and is currently at her heaviest. She is worried about the health implications of her weight, especially after a recent episode of cellulitis that required medical attention. Previously, she managed a minor case of cellulitis at home, but recent swelling led her to seek care at [facility].  She reports increased snoring and episodes of waking up gasping for air, which she attributes to her weight gain. The worsening of her snoring and the resulting discomfort and fatigue from interrupted sleep are concerning to her. She is particularly troubled by the impact of her weight on her breathing and overall comfort.  She is not currently on any weight loss medications. Despite her weight gain, she has maintained normal blood pressure  and lab results. Her weight causes discomfort with prolonged standing, affecting her back and knees.  She has tried intermittent fasting and other dietary changes but finds them unsustainable. She has previously lost weight by walking and altering her diet, losing 30 pounds through these methods. She plans to gradually increase her physical activity, starting with walking.  Past medical history, Surgical history, Family history not pertinant except as noted below, Social history, Allergies, and medications have been entered into the medical record, reviewed, and corrections made.   Review of Systems:  All review of systems negative except what is listed in the HPI  Objective:    Alert and oriented x 4 Speaking in clear sentences with no shortness of breath. No distress.  Impression and Recommendations:    Problem List Items Addressed This Visit     BMI 45.0-49.9, adult (HCC) - Primary   Obesity with a BMI of 45.7. She has not been on weight loss medication and is not currently on a diet or exercise program. Previous attempts at weight loss through diet and exercise have been unsuccessful and frustrating. She is interested in pharmacological intervention to aid weight loss with a new start of diet changes and exercise daily. We discussed options and she would like to try Phentermine as a short-term option due to its affordability and potential effectiveness. Discussed the risks of phentermine, including increased blood pressure and palpitations, and the need for short-term use. Emphasized the importance of continuing diet and exercise to maintain weight loss after medication cessation. We can consider other medications if this is not helpful. We will plan to follow up  in 2 months to monitor progress and determine if changes need to be made or continuation is appropriate.  - Prescribed phentermine, to be taken once daily in the morning. - Advised to start with a half tablet if needed, with the  option to increase to a full tablet. - Encouraged continuation of diet and exercise as part of a lifestyle change. - Advised to monitor for side effects such as increased heart rate or headaches and to discontinue if these occur. - monitor BP at home and stop medication if blood pressure is consistently higher than 135/85.      Relevant Medications   phentermine 37.5 MG capsule   Snoring   Reports increased snoring and episodes of gasping for air during sleep, suggestive of possible obstructive sleep apnea. Weight gain may be contributing to the severity of symptoms. Discussed the potential impact of sleep apnea on weight management and overall health. Consideration of a sleep study to confirm diagnosis, but decision to defer until after trial of phentermine. - Will consider ordering a home sleep study if symptoms persist or worsen after trial of phentermine.       orders and follow up as documented in EMR I discussed the assessment and treatment plan with the patient. The patient was provided an opportunity to ask questions and all were answered. The patient agreed with the plan and demonstrated an understanding of the instructions.   The patient was advised to call back or seek an in-person evaluation if the symptoms worsen or if the condition fails to improve as anticipated.  Follow-Up: 2 months- virtual weight management  I provided 28 minutes of non-face-to-face interaction with this non face-to-face encounter including intake, same-day documentation, and chart review.   Camie CHARLENA Doing, NP , DNP, AGNP-c [facility]

## 2024-08-27 ENCOUNTER — Other Ambulatory Visit: Payer: Self-pay | Admitting: Nurse Practitioner

## 2024-08-27 DIAGNOSIS — Z6841 Body Mass Index (BMI) 40.0 and over, adult: Secondary | ICD-10-CM

## 2024-08-27 NOTE — Telephone Encounter (Signed)
 Last appt 06-2024
